# Patient Record
Sex: Male | Born: 1955 | Race: White | Hispanic: No | Marital: Married | Smoking: Never smoker
Health system: Southern US, Community
[De-identification: ages and names within clinical notes are randomized; demographics above are authoritative.]

## PROBLEM LIST (undated history)

## (undated) DIAGNOSIS — I10 Essential (primary) hypertension: Secondary | ICD-10-CM

## (undated) DIAGNOSIS — I2510 Atherosclerotic heart disease of native coronary artery without angina pectoris: Principal | ICD-10-CM

## (undated) DIAGNOSIS — E669 Obesity, unspecified: Secondary | ICD-10-CM

## (undated) DIAGNOSIS — Z955 Presence of coronary angioplasty implant and graft: Secondary | ICD-10-CM

## (undated) DIAGNOSIS — Z9289 Personal history of other medical treatment: Secondary | ICD-10-CM

## (undated) DIAGNOSIS — E119 Type 2 diabetes mellitus without complications: Secondary | ICD-10-CM

## (undated) DIAGNOSIS — I214 Non-ST elevation (NSTEMI) myocardial infarction: Secondary | ICD-10-CM

## (undated) DIAGNOSIS — E785 Hyperlipidemia, unspecified: Secondary | ICD-10-CM

## (undated) DIAGNOSIS — I82409 Acute embolism and thrombosis of unspecified deep veins of unspecified lower extremity: Secondary | ICD-10-CM

## (undated) DIAGNOSIS — E66811 Obesity, class 1: Secondary | ICD-10-CM

## (undated) HISTORY — DX: Obesity, unspecified: E66.9

## (undated) HISTORY — DX: Atherosclerotic heart disease of native coronary artery without angina pectoris: I25.10

## (undated) HISTORY — DX: Hyperlipidemia, unspecified: E78.5

## (undated) HISTORY — DX: Presence of coronary angioplasty implant and graft: Z95.5

## (undated) HISTORY — DX: Obesity, class 1: E66.811

## (undated) HISTORY — DX: Type 2 diabetes mellitus without complications: E11.9

## (undated) HISTORY — DX: Non-ST elevation (NSTEMI) myocardial infarction: I21.4

## (undated) HISTORY — DX: Acute embolism and thrombosis of unspecified deep veins of unspecified lower extremity: I82.409

## (undated) HISTORY — DX: Personal history of other medical treatment: Z92.89

---

## 2003-05-15 DIAGNOSIS — I82409 Acute embolism and thrombosis of unspecified deep veins of unspecified lower extremity: Secondary | ICD-10-CM

## 2003-05-15 HISTORY — PX: ORIF FEMUR FRACTURE: SHX2119

## 2003-05-15 HISTORY — DX: Acute embolism and thrombosis of unspecified deep veins of unspecified lower extremity: I82.409

## 2010-10-13 DIAGNOSIS — I2510 Atherosclerotic heart disease of native coronary artery without angina pectoris: Secondary | ICD-10-CM

## 2010-10-13 DIAGNOSIS — I214 Non-ST elevation (NSTEMI) myocardial infarction: Secondary | ICD-10-CM

## 2010-10-13 DIAGNOSIS — Z955 Presence of coronary angioplasty implant and graft: Secondary | ICD-10-CM

## 2010-10-13 HISTORY — DX: Atherosclerotic heart disease of native coronary artery without angina pectoris: I25.10

## 2010-10-13 HISTORY — DX: Presence of coronary angioplasty implant and graft: Z95.5

## 2010-10-13 HISTORY — DX: Non-ST elevation (NSTEMI) myocardial infarction: I21.4

## 2010-10-14 DIAGNOSIS — I2510 Atherosclerotic heart disease of native coronary artery without angina pectoris: Secondary | ICD-10-CM | POA: Insufficient documentation

## 2010-10-30 ENCOUNTER — Emergency Department (INDEPENDENT_AMBULATORY_CARE_PROVIDER_SITE_OTHER): Payer: BC Managed Care – PPO

## 2010-10-30 ENCOUNTER — Emergency Department (HOSPITAL_BASED_OUTPATIENT_CLINIC_OR_DEPARTMENT_OTHER)
Admission: EM | Admit: 2010-10-30 | Discharge: 2010-10-31 | Disposition: A | Discharge status: Other Acute Inpatient Hospital | Payer: BC Managed Care – PPO | Source: Home / Self Care | Attending: Emergency Medicine | Admitting: Emergency Medicine

## 2010-10-30 DIAGNOSIS — I214 Non-ST elevation (NSTEMI) myocardial infarction: Secondary | ICD-10-CM | POA: Insufficient documentation

## 2010-10-30 DIAGNOSIS — R079 Chest pain, unspecified: Secondary | ICD-10-CM

## 2010-10-30 LAB — COMPREHENSIVE METABOLIC PANEL
AST: 34 U/L (ref 0–37)
Albumin: 4.2 g/dL (ref 3.5–5.2)
Alkaline Phosphatase: 100 U/L (ref 39–117)
BUN: 17 mg/dL (ref 6–23)
Chloride: 100 mEq/L (ref 96–112)
Creatinine, Ser: 0.9 mg/dL (ref 0.50–1.35)
Potassium: 4.5 mEq/L (ref 3.5–5.1)
Total Bilirubin: 0.2 mg/dL — ABNORMAL LOW (ref 0.3–1.2)
Total Protein: 7.8 g/dL (ref 6.0–8.3)

## 2010-10-30 LAB — URINALYSIS, ROUTINE W REFLEX MICROSCOPIC
Glucose, UA: >1000 mg/dL — AB
Hgb urine dipstick: NEGATIVE
Leukocytes, UA: NEGATIVE
Protein, ur: NEGATIVE mg/dL
pH: 5.5 (ref 5.0–8.0)

## 2010-10-30 LAB — CK TOTAL AND CKMB (NOT AT ARMC)
CK, MB: 2.5 ng/mL (ref 0.3–4.0)
Relative Index: 2 (ref 0.0–2.5)

## 2010-10-30 LAB — URINE MICROSCOPIC-ADD ON
RBC / HPF: NONE SEEN RBC/hpf (ref <3)
WBC, UA: NONE SEEN WBC/hpf (ref <3)

## 2010-10-30 LAB — TROPONIN I: Troponin I: <0.3 ng/mL (ref <0.30)

## 2010-10-31 ENCOUNTER — Inpatient Hospital Stay (HOSPITAL_COMMUNITY)
Admission: AD | Admit: 2010-10-31 | Discharge: 2010-11-04 | DRG: 854 | Disposition: A | Discharge status: Home / Self Care | Payer: BC Managed Care – PPO | Source: Other Acute Inpatient Hospital | Attending: Cardiology | Admitting: Cardiology

## 2010-10-31 DIAGNOSIS — I2510 Atherosclerotic heart disease of native coronary artery without angina pectoris: Principal | ICD-10-CM | POA: Diagnosis present

## 2010-10-31 DIAGNOSIS — I10 Essential (primary) hypertension: Secondary | ICD-10-CM | POA: Diagnosis present

## 2010-10-31 DIAGNOSIS — E119 Type 2 diabetes mellitus without complications: Secondary | ICD-10-CM | POA: Diagnosis present

## 2010-10-31 DIAGNOSIS — E785 Hyperlipidemia, unspecified: Secondary | ICD-10-CM | POA: Diagnosis present

## 2010-10-31 DIAGNOSIS — I200 Unstable angina: Secondary | ICD-10-CM | POA: Diagnosis present

## 2010-10-31 DIAGNOSIS — Z86711 Personal history of pulmonary embolism: Secondary | ICD-10-CM

## 2010-10-31 DIAGNOSIS — Z87891 Personal history of nicotine dependence: Secondary | ICD-10-CM

## 2010-10-31 DIAGNOSIS — Z6839 Body mass index (BMI) 39.0-39.9, adult: Secondary | ICD-10-CM

## 2010-10-31 DIAGNOSIS — Z713 Dietary counseling and surveillance: Secondary | ICD-10-CM

## 2010-10-31 LAB — CK TOTAL AND CKMB (NOT AT ARMC)
CK, MB: 2.4 ng/mL (ref 0.3–4.0)
CK, MB: 1.8 ng/mL (ref 0.3–4.0)
Relative Index: 2.3 (ref 0.0–2.5)
Total CK: 106 U/L (ref 7–232)
Total CK: 85 U/L (ref 7–232)

## 2010-10-31 LAB — COMPREHENSIVE METABOLIC PANEL
AST: 33 U/L (ref 0–37)
Albumin: 3.6 g/dL (ref 3.5–5.2)
BUN: 18 mg/dL (ref 6–23)
Calcium: 9.1 mg/dL (ref 8.4–10.5)
Chloride: 100 mEq/L (ref 96–112)
Creatinine, Ser: 0.8 mg/dL (ref 0.50–1.35)
Total Bilirubin: 0.2 mg/dL — ABNORMAL LOW (ref 0.3–1.2)
Total Protein: 7.3 g/dL (ref 6.0–8.3)

## 2010-10-31 LAB — CBC
Platelets: 237 10*3/uL (ref 150–400)
RBC: 4.86 MIL/uL (ref 4.22–5.81)
RDW: 12.9 % (ref 11.5–15.5)
WBC: 6.9 10*3/uL (ref 4.0–10.5)

## 2010-10-31 LAB — TROPONIN I
Troponin I: <0.3 ng/mL (ref <0.30)
Troponin I: <0.3 ng/mL (ref <0.30)

## 2010-10-31 LAB — GLUCOSE, CAPILLARY: Glucose-Capillary: 216 mg/dL — ABNORMAL HIGH (ref 70–99)

## 2010-10-31 LAB — LIPID PANEL
Cholesterol: 263 mg/dL — ABNORMAL HIGH (ref 0–200)
HDL: 31 mg/dL — ABNORMAL LOW (ref >39)
Triglycerides: 570 mg/dL — ABNORMAL HIGH (ref <150)
VLDL: UNDETERMINED mg/dL (ref 0–40)

## 2010-11-01 LAB — HEPARIN LEVEL (UNFRACTIONATED)
Heparin Unfractionated: 0.15 IU/mL — ABNORMAL LOW (ref 0.30–0.70)
Heparin Unfractionated: 0.31 IU/mL (ref 0.30–0.70)

## 2010-11-01 LAB — GLUCOSE, CAPILLARY
Glucose-Capillary: 228 mg/dL — ABNORMAL HIGH (ref 70–99)
Glucose-Capillary: 138 mg/dL — ABNORMAL HIGH (ref 70–99)

## 2010-11-01 LAB — PROTIME-INR: INR: 1.01 (ref 0.00–1.49)

## 2010-11-01 LAB — CBC
HCT: 40.8 % (ref 39.0–52.0)
RDW: 13.2 % (ref 11.5–15.5)
WBC: 7.3 10*3/uL (ref 4.0–10.5)

## 2010-11-02 HISTORY — PX: CORONARY STENT PLACEMENT: SHX1402

## 2010-11-02 LAB — CBC
HCT: 37.3 % — ABNORMAL LOW (ref 39.0–52.0)
Hemoglobin: 13.1 g/dL (ref 13.0–17.0)
MCH: 29.2 pg (ref 26.0–34.0)
MCHC: 35.1 g/dL (ref 30.0–36.0)

## 2010-11-02 LAB — BASIC METABOLIC PANEL
BUN: 13 mg/dL (ref 6–23)
Calcium: 8.7 mg/dL (ref 8.4–10.5)
GFR calc non Af Amer: >60 mL/min (ref >60)
Glucose, Bld: 213 mg/dL — ABNORMAL HIGH (ref 70–99)

## 2010-11-02 LAB — GLUCOSE, CAPILLARY: Glucose-Capillary: 156 mg/dL — ABNORMAL HIGH (ref 70–99)

## 2010-11-03 HISTORY — PX: CORONARY ANGIOPLASTY WITH STENT PLACEMENT: SHX49

## 2010-11-03 LAB — BASIC METABOLIC PANEL
BUN: 11 mg/dL (ref 6–23)
CO2: 24 mEq/L (ref 19–32)
Chloride: 105 mEq/L (ref 96–112)
Creatinine, Ser: 0.76 mg/dL (ref 0.50–1.35)

## 2010-11-03 LAB — GLUCOSE, CAPILLARY
Glucose-Capillary: 170 mg/dL — ABNORMAL HIGH (ref 70–99)
Glucose-Capillary: 152 mg/dL — ABNORMAL HIGH (ref 70–99)
Glucose-Capillary: 207 mg/dL — ABNORMAL HIGH (ref 70–99)

## 2010-11-03 LAB — CBC
Hemoglobin: 12.7 g/dL — ABNORMAL LOW (ref 13.0–17.0)
MCH: 28.6 pg (ref 26.0–34.0)
MCV: 83.8 fL (ref 78.0–100.0)
RBC: 4.44 MIL/uL (ref 4.22–5.81)

## 2010-11-03 LAB — POCT ACTIVATED CLOTTING TIME: Activated Clotting Time: 413 seconds

## 2010-11-03 LAB — HEPARIN LEVEL (UNFRACTIONATED): Heparin Unfractionated: 0.42 IU/mL (ref 0.30–0.70)

## 2010-11-04 LAB — CBC
HCT: 38.6 % — ABNORMAL LOW (ref 39.0–52.0)
MCV: 83.4 fL (ref 78.0–100.0)
RDW: 13.3 % (ref 11.5–15.5)
WBC: 7.7 10*3/uL (ref 4.0–10.5)

## 2010-11-04 LAB — BASIC METABOLIC PANEL
CO2: 22 mEq/L (ref 19–32)
Calcium: 8.6 mg/dL (ref 8.4–10.5)
Glucose, Bld: 138 mg/dL — ABNORMAL HIGH (ref 70–99)
Sodium: 138 mEq/L (ref 135–145)

## 2010-11-04 NOTE — Cardiovascular Report (Signed)
NAME:  Gregory, Gregory NO.:  [id number]  MEDICAL RECORD NO.:  [id number]  LOCATION:  6529                         FACILITY:  [facility]  PHYSICIAN:  Landry Corporal, MD DATE OF BIRTH:  06-Jan-1956  DATE OF PROCEDURE:  11/02/2010 DATE OF DISCHARGE:                           CARDIAC CATHETERIZATION   PRIMARY CARDIOLOGIST:  Thurmon Fair, MD, [facility].  PERFORMING PHYSICIAN:  Landry Corporal, MD.  PROCEDURE PERFORMED: 1. Successful percutaneous coronary intervention to the mid LAD, 90-     95% lesion decreased to 0% with an Integrity Resolute Drug-Eluting     Stent 2.75-mm x 14-mm, post-dilated proximally to 3.3-mm, and distally     to 3.1-mm. 2. Successful percutaneous coronary intervention to the proximal     second diagonal branch with an Integrity Resolute Drug-Eluting Stent,     2.25-mm x 12-mm stent, post-dilated to 2.3 mm. 3. Intracoronary nitroglycerin.  INDICATIONS:  Unstable angina with multivessel disease.  BRIEF HISTORY:  Gregory Gregory is a 55 year old gentleman who was admitted initially to the Hospitalist Service on June 19 with significant chest pain that was exertional associated with shortness of breath and diaphoresis.  This has been going on for the last several weeks, has been progressively worsened and concerning for crescendo angina.  He was seen by Dr. Royann Shivers in consultation and decision was made to proceed with diagnostic cardiac catheterization which was performed on the 20th and this showed essentially 4 vessel disease with the most significant being across the mid proximal LAD lesion of 95% as well as second diagonal branch lesion that was at least 70%.  This is in addition to a smaller obtuse marginal branch lesion of 90% in the RCA lesion.  There is a small punctate ulcerated lesion that is 85-90%.  Decision was made after a long discussion with the patient based on the focal nature of the  disease that he would likely benefit more from multivessel percutaneous intervention of drug-eluting stents, then going for coronary bypass grafting for the existing lesions.  The patient was in favor of this plan.  I also discussed with several of my colleagues. All of them agreed that this was a good option for the patient even though he has diagnosis of diabetes.  Informed consent was obtained and signed form placed on the chart after the risks, benefits, alternatives, and indications of procedure were explained.  PROCEDURE:  The patient was brought to the second floor of [facility] in a fasting state.  He was prepped and draped in a usual sterile fashion for femoral artery access.  After time- out period was performed, the patient was sedated with intravenous Versed and fentanyl.  The right femoral head was localized using tactile and fluoroscopic guidance.  The right groin was anesthetized using 1% subcutaneous lidocaine.  Right femoral artery was then accessed using the modified Seldinger technique with placement of 6-French sheath. Once the sheath was aspirated and flushed, a 6-French XB 3.5 guide catheter was advanced over wire and used to engage left main coronary artery.  At that time of sheath placement, Angiomax bolus was administered and drip was initiated.  After 2 minutes, ACT was  checked and confirmed greater than 200 seconds.  At this time, diagnostic imaging was performed with intracoronary nitroglycerin.  INTERVENTIONAL PROCEDURE: Lesion #1:  Mid LAD, 90% napkin ring lesion just after the D2 with TIMI 3 flow      Post Intervention: 0% with TIMI 3 flow.   Lesion #2:  Proximal D2: Tandem 70% and 60%; TIMI 3 flow      Post Intervention: 0%, TIMI 3 flow Guide:  Cordis XB 3.5; Guidewire:  BMW - LAD, Prowater - D2  At this time both the LAD and the D2 branch were wired.  Initially the LAD lesion was pre-dilated:    * Predilatation balloon  Emerge 2.5-mm x 8-mm:    8 atmospheres for 28 seconds x2.    After predilatation of the LAD, attention was turned to Lesion #2 in D2 which was  treated with direct stenting technique.     *  Lesion #2, D2 -- Stent:  Integrity Resolute DES 2.25 mm x 12 mm:  10 atmospheres 45 seconds.  12 atmospheres 20 seconds -- Final Diameter = 2.3 mm.    *  Lesion #1, LAD -- Stent:  Integrity Resolute DES 2.75-mm x 14-mm.  14 atmospheres 30 seconds.  16 atmospheres 45 seconds, Final distal diameter = 3.1 mm.       Postdilatation balloon:  Crowder Trek 3.28 x 8 mm.   14 atmospheres 45 seconds - final promimal diameter = 3.3 mm.  Scout angiography after each inflation demonstrated no evidence of perforation or dissection.  The diagonal lesion was not all way to the ostium of the LAD and therefore kissing balloon was not performed. There was slight jailing of the diagonal branch but brisk TIMI 3 flow distally.  At this time, based on the amount of contrast used, decision was made to discontinue the procedure at this point and to perform intervention on the additional 2 lesions another day.  IMPRESSION:  Successful 2-vessel PCI of the mid left anterior descending and proximal second diagonal branch lesions with drug-eluting stents.  PLAN: 1. Continue aspirin and prasugrel and titrate up cardiac medications. 2. Restart heparin 4 hours after sheath pull. 3. Proceed with staged PCI of the RCA in OM1 tomorrow if the creatinine is okay.          ______________________________ Landry Corporal, MD    DWH/MEDQ  D:  11/03/2010  T:  11/03/2010  Job:  [id number]  cc:   Thurmon Fair, MD  Electronically Signed by Bryan Lemma MD on 11/04/2010 11:53:30 AM

## 2010-11-04 NOTE — Cardiovascular Report (Signed)
NAME:  Gregory Gregory, BORG NO.:  [id number]  MEDICAL RECORD NO.:  [id number]  LOCATION:  6529                         FACILITY:  [facility]  PHYSICIAN:  Landry Corporal, MD DATE OF BIRTH:  12/02/1955  DATE OF PROCEDURE:  11/03/2010 DATE OF DISCHARGE:                           CARDIAC CATHETERIZATION   PRIMARY CARDIOLOGIST:  Thurmon Fair, MD  PERFORMING PHYSICIAN:  Landry Corporal, MD.  PROCEDURE PERFORMED: 1. Left heart catheterization via 5-French right radial artery access     for measuring left ventricular hemodynamics. 2. Native coronary angiography to evaluate the LAD and diagonal stents     placed on the day preceding. 3. Intracoronary nitroglycerin x2. 4. Percutaneous coronary intervention of the mid RCA with resolute DES     2.7 x 32 mm stent increased to 3.0 mm. 5. Successful PCI to the proximal first obtuse marginal branch (OM1) with 2     overlapping resolute DES stents 2.25 x 12 and 2.25 x 8 mm increased     to 2.3 mm.  INDICATIONS:  Multivessel coronary artery disease with newly diagnosed diabetes and unstable angina.  BRIEF HISTORY:  Gregory Gregory is a patient well-known to me.  Please see the last preceding 2 catheterizations describes as history.  He is now referred for the second part portion of a stage PCI to the RCA and OM1. Again, the risks, benefits, alternatives, and indications were explained to the patient in detail and informed consent was obtained with a signed form placed on chart.  The patient was brought to the Second Floor of Cardiac Catheterization Lab and prepped and draped in usual sterile fashion for radial access as a Barbeau test was performed and found to be normal.  After time-out period was performed.  The patient was sedated with intravenous Versed and fentanyl.  The right wrist was anesthetized using 1%  subcutaneous lidocaine and the right radial artery was accessed using the Seldinger technique with placement  of 6-French sheath.  A total of 10 mL dilated radial cocktail was infiltrated into the sheath.  The patient was then had Angiomax bolus was administered with a drip initiated.  The safety J- wire was advanced using a 5-French JR-4 catheter into the recent ascending aortic cusp.  The 5-French catheter was then exchanged for a 6- [place] JR-4 guide catheter which was then used to advance into the aortic valve into the aortic cusps.  The catheter first entered into the left ventricle for measuring left ventricular hemodynamics and then pullback gradient to see how he is doing as far as filling pressures. The catheter was then at that time directed into the right coronary artery.  INTERVENTION PROCEDURE: Lesion #1: mid RCA 80% to 85% with diffuse 50-60% lesions just after         Post PCI: reduced to 0%          TIMI 3 flow pre and post.  Guide Catheter: 6-French JR-4; Guidewire Prowater.  Predilatation balloon Trek 2.5 mm x 50 mm balloon:          8 atmospheres for 35 seconds.  Stent: Integrity Resolute DES 2.75 mm x 20 mm    12 atmospheres for 35 seconds  14 atmospheres at 35 seconds  Postdilatation balloon is a 3.0 x 15 mm Trek balloon:   Distal: 14 atmospheres for 45 seconds    Proximal:  16 atmospheres for 45 seconds.  Angiographic evaluation, post PCI demonstrated excellent stent positioning with no dissection perforation.  The attention was then turned to the left coronary system.  The R4 catheter was exchanged over a wire.  Initially, a 6-French XB 3.5 guide catheter was unable to engage the left coronary artery and therefore, it was exchanged for a 16-French JL 35 guide catheter, which was then used to successfully engage the left coronary artery.  Lesion #2: Proximal OM-1 95% lesion, TIMI 3 flow     Post PCI: 0% with the TIMI 3 flow   Guide Catheter:  JL-3.5; Guidewire:  Intubation.  Stent #1 Resolute DES 2.25 mm x 12 mm.     9 atmospheres for 30 seconds. With placement  of the stent, the patient did have significant anginal chest pain. The stent was deployed at nominal atmospheres.  After initial deployment, there was a focal lesion just proximal to the proximal leading edge of the stent.  Therefore,  I decided to proceed with deploying an overlapping stent to cover this lesion in the proximal segment just beyond the ostium of the OM1.   Stent #2:  Integrity Resolute DES 2.25 x 8 mm    14 atmospheres at 50 seconds.   18 atmospheres at 25 seconds; at the overlap section.  After the second stent, there was brisk TIMI 3 flow distally and no evidence of proximal  or distal dissection.  The proximal lesion was well covered.  The wire was removed  completely out of the body and the catheter was then exchange, it was removed completely  out of the body over a wire without any complications.  The sheath was removed in the cath lab, with placement of a TR Band at 16 cm of air.  The patient was stable before and after the procedure.  The chest pain was relieved at the end of BiV second balloon stent inflation in the circumflex.  He was  hemodynamically stable before, during, and after the procedure with no complications.  Estimated blood loss was less than 20 mL.  The Angiomax was discontinued at the time of TR band placed.  The patient was returned to the holding area for monitoring and then will go to 6500 for post PCI care.  IMPRESSION: 1. Successful multivessel percutaneous coronary intervention of the     mid right coronary artery and proximal obtuse marginal 1 as part of     a 4-vessel percutaneous coronary intervention with 2-vessel done     yesterday. 2. Widely patent left anterior descending and second diagonal stents. 3. Mild increase IVP after hydration with no evidence of congestive     heart failure.  PLAN: 1. The patient will return to 6500 for overnight monitoring and post     radial care. 2. Continued to do dual antiplatelet therapy for  at least a year. 3. He continues diabetic care with [facility] assisting use     with his dosing regimen. 4. We will need to be established for post discharge  primary care     followup. 5. Continue his statin, ACE inhibitor and beta-blocker.  We would like     to be increase the ACE inhibitor upon initial follow-up. 6. Cardiac rehab as scheduled.  We will anticipate discharge in the morning.  The patient is  stable.  He is either followup with myself or Dr. Royann Shivers.          ______________________________ Landry Corporal, MD     DWH/MEDQ  D:  11/03/2010  T:  11/04/2010  Job:  [id number]  cc:   Thurmon Fair, MD  Electronically Signed by Bryan Lemma MD on 11/04/2010 12:04:29 PM
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
## 2012-02-04 ENCOUNTER — Emergency Department (HOSPITAL_BASED_OUTPATIENT_CLINIC_OR_DEPARTMENT_OTHER): Payer: BC Managed Care – PPO

## 2012-02-04 ENCOUNTER — Encounter (HOSPITAL_BASED_OUTPATIENT_CLINIC_OR_DEPARTMENT_OTHER): Payer: Self-pay | Admitting: Emergency Medicine

## 2012-02-04 ENCOUNTER — Emergency Department (HOSPITAL_BASED_OUTPATIENT_CLINIC_OR_DEPARTMENT_OTHER)
Admission: EM | Admit: 2012-02-04 | Discharge: 2012-02-04 | Disposition: A | Discharge status: Home / Self Care | Payer: BC Managed Care – PPO | Attending: Emergency Medicine | Admitting: Emergency Medicine

## 2012-02-04 DIAGNOSIS — N390 Urinary tract infection, site not specified: Secondary | ICD-10-CM | POA: Insufficient documentation

## 2012-02-04 DIAGNOSIS — Z79899 Other long term (current) drug therapy: Secondary | ICD-10-CM | POA: Insufficient documentation

## 2012-02-04 DIAGNOSIS — Z7982 Long term (current) use of aspirin: Secondary | ICD-10-CM | POA: Insufficient documentation

## 2012-02-04 DIAGNOSIS — N200 Calculus of kidney: Secondary | ICD-10-CM | POA: Insufficient documentation

## 2012-02-04 DIAGNOSIS — E119 Type 2 diabetes mellitus without complications: Secondary | ICD-10-CM | POA: Insufficient documentation

## 2012-02-04 DIAGNOSIS — I10 Essential (primary) hypertension: Secondary | ICD-10-CM | POA: Insufficient documentation

## 2012-02-04 HISTORY — DX: Essential (primary) hypertension: I10

## 2012-02-04 LAB — URINALYSIS, ROUTINE W REFLEX MICROSCOPIC
Bilirubin Urine: NEGATIVE
Glucose, UA: NEGATIVE mg/dL
Specific Gravity, Urine: 1.017 (ref 1.005–1.030)
pH: 5 (ref 5.0–8.0)

## 2012-02-04 LAB — URINE MICROSCOPIC-ADD ON

## 2012-02-04 MED ORDER — KETOROLAC TROMETHAMINE 60 MG/2ML IM SOLN
60.0000 mg | Freq: Once | INTRAMUSCULAR | Status: AC
  Administered 2012-02-04: 60 mg via INTRAMUSCULAR
  Filled 2012-02-04: qty 2

## 2012-02-04 MED ORDER — CIPROFLOXACIN HCL 500 MG PO TABS: 500.0000 mg | ORAL_TABLET | Freq: Two times a day (BID) | ORAL | Status: DC

## 2012-02-04 MED ORDER — HYDROCODONE-ACETAMINOPHEN 5-325 MG PO TABS: 2.0000 | ORAL_TABLET | ORAL | Status: DC | PRN

## 2012-02-04 MED ORDER — IBUPROFEN 600 MG PO TABS: 600.0000 mg | ORAL_TABLET | Freq: Four times a day (QID) | ORAL | Status: DC | PRN

## 2012-02-04 NOTE — ED Notes (Signed)
C/o of left sided back pain that began this Friday. C/o of urinary frequency

## 2012-02-04 NOTE — ED Provider Notes (Signed)
History     CSN: [id number]  Arrival date & time 02/04/12  0709   First MD Initiated Contact with Patient 02/04/12 [phone]      Chief Complaint  Patient presents with  . Back Pain     HPI C/o of left sided back pain that began this Friday. C/o of urinary frequency.  Some dysuria.  No documented fever.  No nausea vomiting.  Past Medical History  Diagnosis Date  . Hypertension   . Diabetes mellitus     Past Surgical History  Procedure Date  . Coronary stent placement     No family history on file.  History  Substance Use Topics  . Smoking status: Never Smoker   . Smokeless tobacco: Not on file  . Alcohol Use: No      Review of Systems  All other systems reviewed and are negative.    Allergies  Review of patient's allergies indicates no known allergies.  Home Medications   Current Outpatient Rx  Name Route Sig Dispense Refill  . ASPIRIN 325 MG PO TBEC Oral Take 325 mg by mouth daily.    Marland Kitchen LISINOPRIL 5 MG PO TABS Oral Take 5 mg by mouth daily.    Marland Kitchen METOPROLOL TARTRATE 50 MG PO TABS Oral Take 50 mg by mouth 2 (two) times daily.    . OMEGA-3-ACID ETHYL ESTERS 1 G PO CAPS Oral Take 2 g by mouth 2 (two) times daily.    Marland Kitchen PRASUGREL HCL 10 MG PO TABS Oral Take by mouth.    . ROSUVASTATIN CALCIUM 10 MG PO TABS Oral Take 10 mg by mouth daily.    Marland Kitchen CIPROFLOXACIN HCL 500 MG PO TABS Oral Take 1 tablet (500 mg total) by mouth every 12 (twelve) hours. 10 tablet 0  . HYDROCODONE-ACETAMINOPHEN 5-325 MG PO TABS Oral Take 2 tablets by mouth every 4 (four) hours as needed for pain. 15 tablet 0  . IBUPROFEN 600 MG PO TABS Oral Take 1 tablet (600 mg total) by mouth every 6 (six) hours as needed for pain. 30 tablet 0    BP 150/63  Pulse 54  Temp 97.6 F (36.4 C) (Oral)  Resp 18  SpO2 100%  Physical Exam  Nursing note and vitals reviewed. Constitutional: He is oriented to person, place, and time. He appears well-developed. No distress.  HENT:  Head: Normocephalic and  atraumatic.  Eyes: Pupils are equal, round, and reactive to light.  Neck: Normal range of motion.  Cardiovascular: Normal rate and intact distal pulses.   Pulmonary/Chest: No respiratory distress.  Abdominal: Normal appearance. He exhibits no distension. There is no tenderness. There is no rebound and no guarding.  Genitourinary:       No CVA tenderness.  Musculoskeletal: Normal range of motion.  Neurological: He is alert and oriented to person, place, and time. No cranial nerve deficit.  Skin: Skin is warm and dry. No rash noted.  Psychiatric: He has a normal mood and affect. His behavior is normal.    ED Course  Procedures (including critical care time) Scheduled Meds:    . ketorolac  60 mg Intramuscular Once   Continuous Infusions:  PRN Meds:.  Labs Reviewed  URINALYSIS, ROUTINE W REFLEX MICROSCOPIC - Abnormal; Notable for the following:    APPearance CLOUDY (*)     Hgb urine dipstick LARGE (*)     Leukocytes, UA LARGE (*)     All other components within normal limits  URINE MICROSCOPIC-ADD ON - Abnormal; Notable for the  following:    Squamous Epithelial / LPF FEW (*)     Bacteria, UA MANY (*)     Casts GRANULAR CAST (*)     All other components within normal limits  URINE CULTURE   Ct Abdomen Pelvis Wo Contrast  02/04/2012  *RADIOLOGY REPORT*  Clinical Data:  Left side abdominal pain  CT ABDOMEN AND PELVIS WITHOUT CONTRAST  Technique:  Multidetector CT imaging of the abdomen and pelvis was performed following the standard protocol without intravenous contrast. Sagittal and coronal MPR images reconstructed from axial data set.  Comparison: None  Findings: Lung bases clear. IVC filter noted. Tiny nonobstructing calculus left kidney image 32. Probable small cysts left kidney images 37 and 39. No additional urinary tract calcification, hydronephrosis, or ureteral dilatation. Bladder decompressed. Minimal prostatic enlargement. Tiny umbilical hernia containing fat.  Within limits  of a nonenhanced exam no focal abnormalities of the liver, spleen, pancreas, adrenal glands, or right kidney. Small splenule at splenic hilum. Minimal cysts descending colonic diverticulosis without evidence of diverticulitis. Medication tablets within bowel. Normal appendix coiled adjacent to cecal tip. Stomach and bowel loops otherwise normal appearance. No mass, adenopathy, free fluid, or inflammatory process. No acute osseous findings. Bulging discs at L3-L4 L4-L5  IMPRESSION: Tiny nonobstructing calculus and probable tiny cyst left kidney. No definite hydronephrosis or ureteral dilatation. Minimal colonic diverticulosis. Tiny umbilical hernia containing fat.   Original Report Authenticated By: Lollie Marrow, M.D.      1. UTI (lower urinary tract infection)       MDM          Nelia Shi, MD 02/05/12 [phone]

## 2012-02-06 LAB — URINE CULTURE

## 2012-09-13 IMAGING — CR DG CHEST 2V
2 series · 2 of 2 positions shown · non-contrast
Comparison: None

CLINICAL DATA: Chest pain

CHEST - 2 VIEW

[w chest pa]
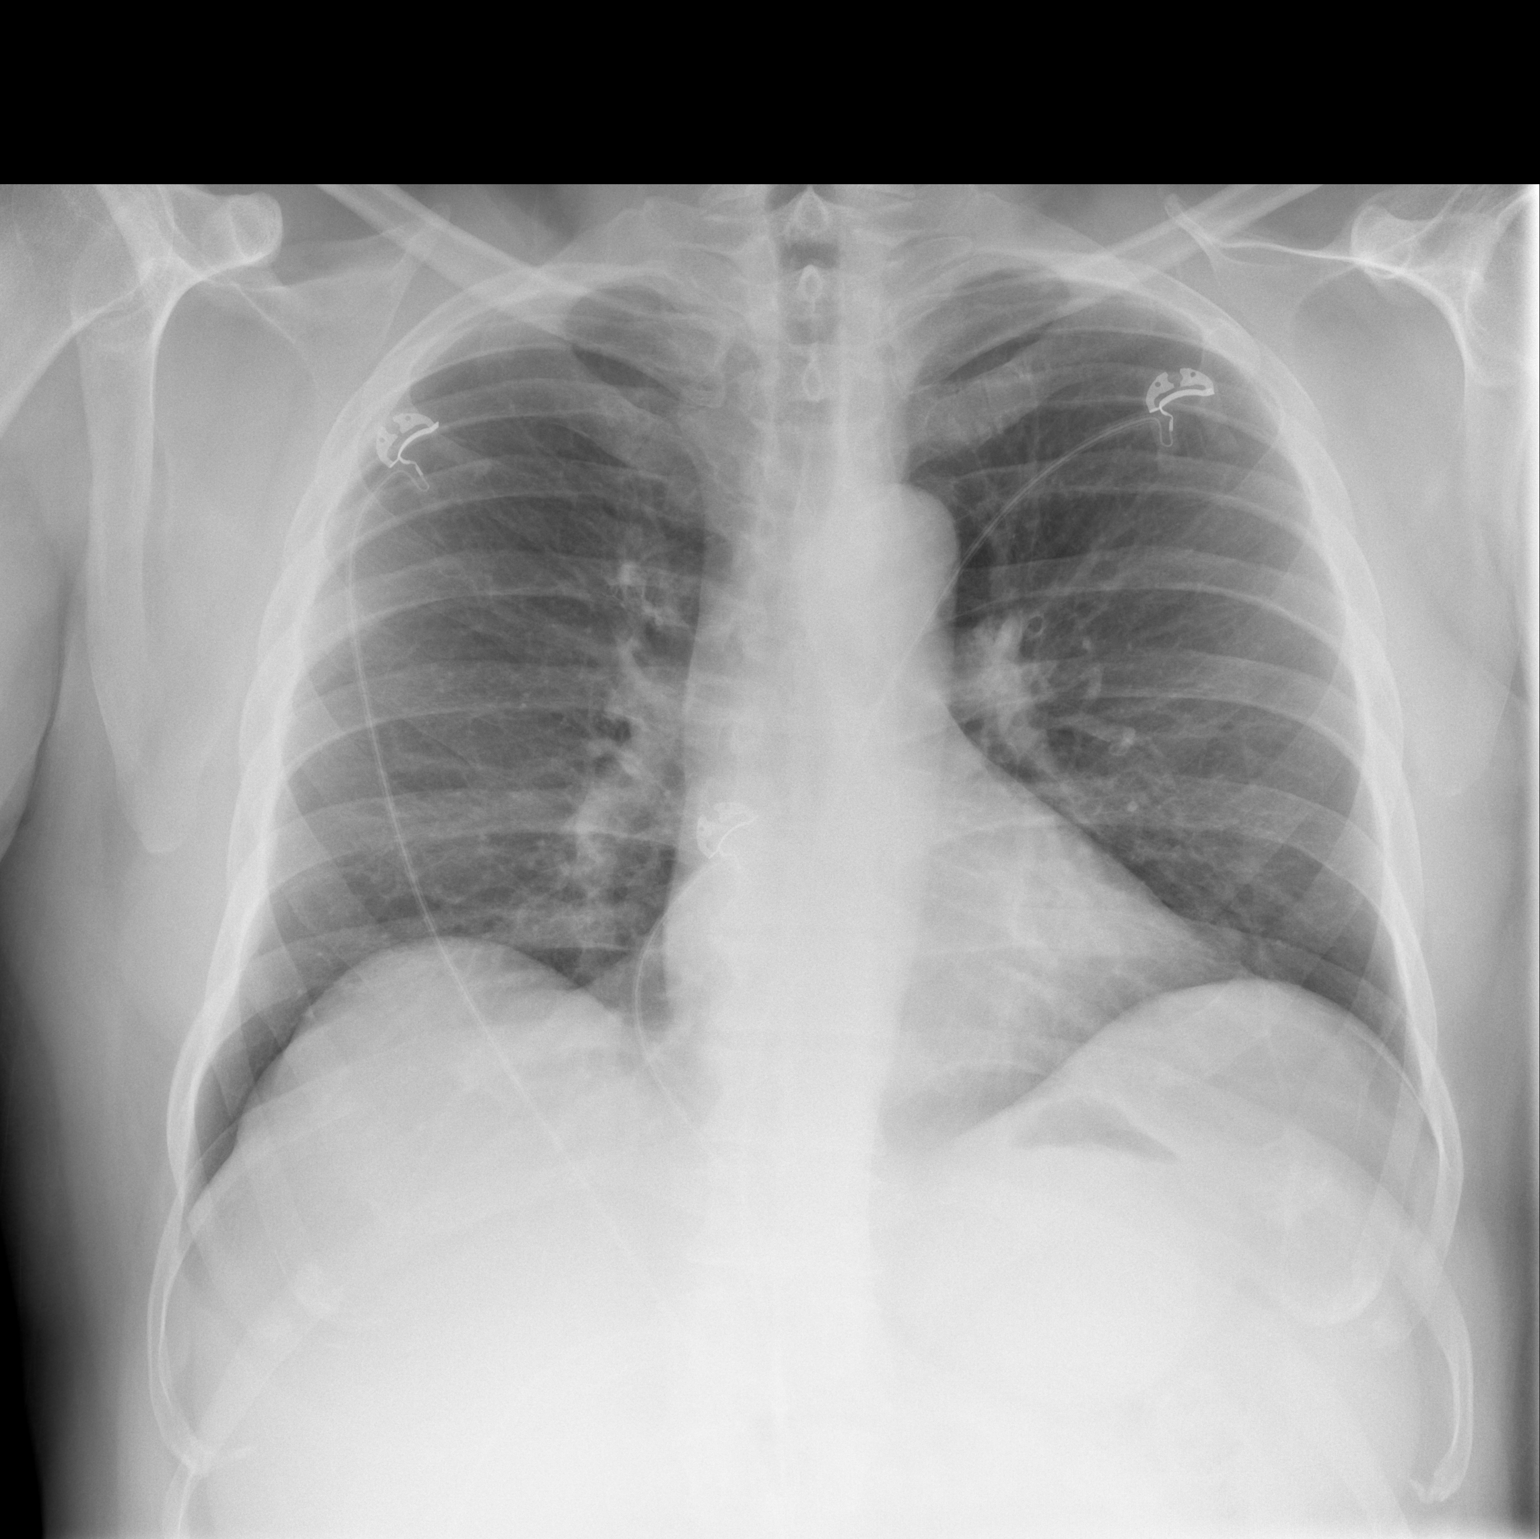

[w chest lat]
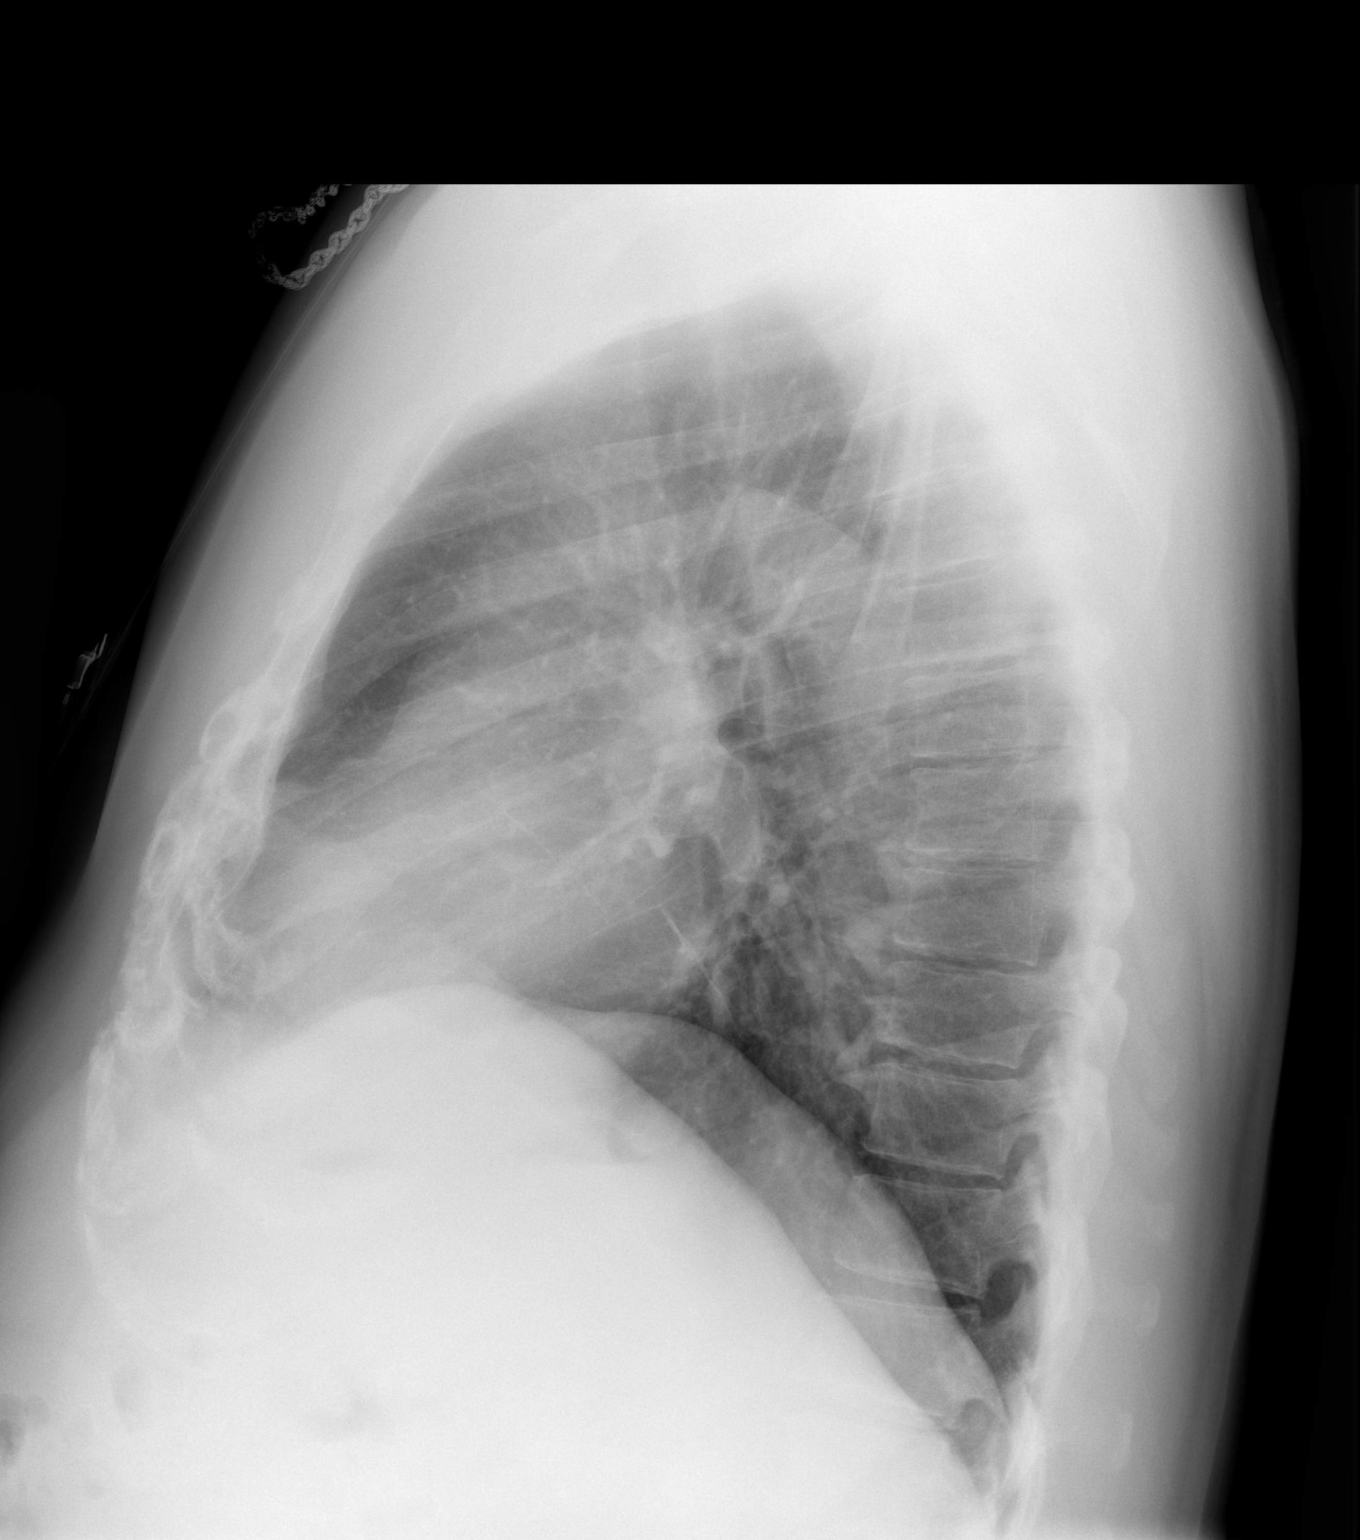

[2 of 2 positions shown; findings below may reference images not displayed]

FINDINGS: The heart size and mediastinal contours are within normal
limits.  Both lungs are clear.  The visualized skeletal structures
are unremarkable.
IMPRESSION: No active cardiopulmonary abnormalities

## 2012-12-04 ENCOUNTER — Other Ambulatory Visit: Payer: Self-pay | Admitting: Physician Assistant

## 2012-12-31 ENCOUNTER — Encounter: Payer: Self-pay | Admitting: Cardiology

## 2012-12-31 DIAGNOSIS — I2510 Atherosclerotic heart disease of native coronary artery without angina pectoris: Secondary | ICD-10-CM

## 2012-12-31 DIAGNOSIS — E1169 Type 2 diabetes mellitus with other specified complication: Secondary | ICD-10-CM | POA: Insufficient documentation

## 2012-12-31 DIAGNOSIS — E785 Hyperlipidemia, unspecified: Secondary | ICD-10-CM

## 2012-12-31 DIAGNOSIS — I10 Essential (primary) hypertension: Secondary | ICD-10-CM | POA: Insufficient documentation

## 2012-12-31 DIAGNOSIS — E669 Obesity, unspecified: Secondary | ICD-10-CM

## 2013-01-02 ENCOUNTER — Encounter: Payer: Self-pay | Admitting: Cardiology

## 2013-01-05 ENCOUNTER — Ambulatory Visit (INDEPENDENT_AMBULATORY_CARE_PROVIDER_SITE_OTHER): Payer: BC Managed Care – PPO | Admitting: Cardiology

## 2013-01-05 ENCOUNTER — Encounter: Payer: Self-pay | Admitting: Cardiology

## 2013-01-05 VITALS — BP 122/68 | HR 44 | Ht 70.0 in | Wt 246.1 lb

## 2013-01-05 DIAGNOSIS — Z79899 Other long term (current) drug therapy: Secondary | ICD-10-CM

## 2013-01-05 DIAGNOSIS — Z9861 Coronary angioplasty status: Secondary | ICD-10-CM

## 2013-01-05 DIAGNOSIS — E782 Mixed hyperlipidemia: Secondary | ICD-10-CM

## 2013-01-05 DIAGNOSIS — E785 Hyperlipidemia, unspecified: Secondary | ICD-10-CM

## 2013-01-05 DIAGNOSIS — I10 Essential (primary) hypertension: Secondary | ICD-10-CM

## 2013-01-05 DIAGNOSIS — E669 Obesity, unspecified: Secondary | ICD-10-CM

## 2013-01-05 DIAGNOSIS — E119 Type 2 diabetes mellitus without complications: Secondary | ICD-10-CM

## 2013-01-05 DIAGNOSIS — I2510 Atherosclerotic heart disease of native coronary artery without angina pectoris: Secondary | ICD-10-CM

## 2013-01-05 DIAGNOSIS — E1169 Type 2 diabetes mellitus with other specified complication: Secondary | ICD-10-CM

## 2013-01-05 LAB — LIPID PANEL
HDL: 35 mg/dL — ABNORMAL LOW (ref >39)
Triglycerides: 165 mg/dL — ABNORMAL HIGH (ref <150)

## 2013-01-05 LAB — COMPREHENSIVE METABOLIC PANEL
Albumin: 4.7 g/dL (ref 3.5–5.2)
BUN: 21 mg/dL (ref 6–23)
Calcium: 9.5 mg/dL (ref 8.4–10.5)
Chloride: 104 mEq/L (ref 96–112)
Glucose, Bld: 112 mg/dL — ABNORMAL HIGH (ref 70–99)
Potassium: 4.7 mEq/L (ref 3.5–5.3)
Total Protein: 7.7 g/dL (ref 6.0–8.3)

## 2013-01-05 MED ORDER — ROSUVASTATIN CALCIUM 10 MG PO TABS: 10.0000 mg | ORAL_TABLET | Freq: Every day | ORAL | Status: DC

## 2013-01-05 MED ORDER — LISINOPRIL 10 MG PO TABS: 10.0000 mg | ORAL_TABLET | Freq: Every day | ORAL | Status: DC

## 2013-01-05 MED ORDER — OMEGA-3-ACID ETHYL ESTERS 1 G PO CAPS: 2.0000 g | ORAL_CAPSULE | Freq: Two times a day (BID) | ORAL | Status: DC

## 2013-01-05 MED ORDER — METOPROLOL TARTRATE 25 MG PO TABS: ORAL_TABLET | ORAL | Status: DC

## 2013-01-05 NOTE — Assessment & Plan Note (Signed)
Stable. No angina or CHF symptoms. We had switched from Effient to Plavix & subsequently full dose aspirin for prophylaxis. He is currently on beta blocker ACE inhibitor and statin.  Plan: Continue current medication regimen.

## 2013-01-05 NOTE — Assessment & Plan Note (Signed)
Not unexpectedly, he sort of a plateau of weight loss. He was able to be down about 10 pounds lower years now. He just is down a little bit "lax on his die"t. Is not quite the same intensity of exercises that he used to do.  Plan: I encouraged him to continue to go forward. He was doing great well I do want him to get discouraged. His dramatically improves himself. His target weight loss goal now is only 24 pounds this year.

## 2013-01-05 NOTE — Progress Notes (Signed)
Patient ID: Gregory Gregory, male   DOB: 1956/04/08, 57 y.o.   MRN: [id number] PCP: Thayer Headings, MD  Clinic Note: Chief Complaint  Patient presents with  . Annual Exam    no chest pain, edema some work 12 hour days,some sob if exercising,     HPI: Gregory Gregory is a 57 y.o. male who I first met back in June of 2000 1220 presented with a non-ST elevation MI. At that time he was obese 80 pounds heavier than he is now. He newly diagnosed diabetes and hypertension as well as dyslipidemia. He then underwent cardiac catheterization which revealed significant LAD diagonal bifurcation disease, OM1 disease and RCA disease. After long discussion, given his young age, we agreed that the best plan treatment option was to proceed with multivessel PCI as opposed to CABG. He then underwent staged PCI of first the RCA and OM and an in the LAD- diagonal bifurcation as described below. Within the first year of his intervention, he lost 80 pounds. He is now off of diabetes medications, and is pressures are extremely well controlled. I last saw him a year ago, I backed off on his beta blocker to 25 mg twice a day due to the bradycardia. His lipids are relatively well controlled as well.  Interval History: Today comes in again in good spirits. Feeling well. His weight has been stabilized and plateaued now it. He said he went down to about 235, but then injured himself in and and was able to exercise much as he had been doing. His is also minimally "lax with his eating habits. He still is translucent weight though. He does need to get back into his regular set of routine eating. He walks now 3 days a week and Mr. the amount of time in this case. Says because of wrong or do strenuous activity he gets a little short of breath. Never has any chest tightness or chest pressure associated with it. No PND, orthopnea with only mild intermittent edema. No melena, hematochezia or hematuria. We stopped his Plavix a while ago it  increased him to 325 mg of aspirin. He denies any lightheadedness, dizziness no syncope or near syncope. No TIA or amaurosis fugax symptoms.  No fatigue or exercise intolerance.  Past Medical History  Diagnosis Date  . NSTEMI (non-ST elevated myocardial infarction) 10/2010    multivessel PCI  . CAD (coronary artery disease) 10/2010    multivessel PCI RCA; OM1; LAD;diag  . Presence of drug coated stent in anterior descending branch of left coronary artery June 2012    Resolute DES stents: LAD- 2.75 mm x 14 mm -- 3.3 mm; Diag - 2.25 mm x 12 mm -- 2.3 mm  . Presence of drug coated stent in left circumflex coronary artery June 2012    OM1 -- 2 overlapping Resolute DES Stents: 2.25 mm 8mm, x 71m  . Presence of drug coated stent in right coronary artery June 2012    Mid RCA 2.75 mm x 22 mm  . H/O echocardiogram     EF 50-60%; grade 1 diastolic dysfunction; trivial TR  . Hypertension   . Diabetes mellitus type 2, controlled     No longer on Medcations after weight loss.  . Obesity (BMI 30.0-34.9)     Lost  > 80 lb since NSTEMI  . Hyperlipidemia LDL goal < 70     Prior Cardiac Evaluation and Past Surgical History: Past Surgical History  Procedure Laterality Date  . Coronary stent placement  11/01/12  PCI to mid LAD with integrity resolute DES; PCI to proximal second diagonal branch with resolute integrity  . Coronary angioplasty with stent placement  11/02/2012    PCI of mid RCA and proximal OM 1 with Resolute DESs  . Orif femur fracture Right     Allergies  Allergen Reactions  . Bee Venom     Current Outpatient Prescriptions  Medication Sig Dispense Refill  . aspirin 325 MG EC tablet Take 325 mg by mouth daily.      Gregory Kitchen omega-3 acid ethyl esters (LOVAZA) 1 G capsule Take 2 capsules (2 g total) by mouth 2 (two) times daily.  30 capsule  11  . rosuvastatin (CRESTOR) 10 MG tablet Take 1 tablet (10 mg total) by mouth daily.  30 tablet  11  . lisinopril (PRINIVIL,ZESTRIL) 10 MG tablet  Take 1 tablet (10 mg total) by mouth daily.  30 tablet  11  . metoprolol tartrate (LOPRESSOR) 25 MG tablet Take 1/2 tablet twice a day  30 tablet  11   No current facility-administered medications for this visit.   At the time of this visit -- was taking Lisinopril 5 mg & Metoprolol 1/2 of 50 mg tab bid -- listed medications reflect changes made.  History   Social History Narrative   He is a divorced father of 5, grandfather 1. He now has a new long-term life partner, usually comes with him to appointments the ED his exercise routinely walking at least 2-3 miles a day and this case to 4 days a week. He also does him some strength and conditioning exercises as well.   He never was a smoker. He does not drink alcohol.   ROS: A comprehensive Review of Systems - Negative except Pertinent Symptoms noted above. Otherwise only mild joint aches & pains.  A bit frustrated at "plateau in wgt loss" Remainder is negative.  PHYSICAL EXAM BP 122/68  Pulse 44  Ht 5\' 10"  (1.778 m)  Wt 246 lb 1.6 oz (111.63 kg)  BMI 35.31 kg/m2 General: he is a very pleasant, healthy-appearing gentleman. He continues to be in a very good mood, very pleasant mood and affect. HEENT: NCAT. EOMI. MMM. Anicteric sclerae.  Neck: Supple. No LAN, JVD, or carotid bruit. Heart: RRR. Normal S1, S2. No M/R/G. Nondisplaced PMI Lungs: CTAB. Nonlabored. Normal effort. Good air movement. No wheezes, rales, or rhonchi.  Abdomen: Mild truncal obesity but otherwise soft/NT/ND/NABS. No HSM.  Extremities: No C/C/E. 2+equal pulses throughout.   [id number] today: Yes Rate: 44 , Rhythm: Profound sinus bradycardia, otherwise normal ECG.  Recent Labs: None for this year.  ASSESSMENT / PLAN: CAD (coronary artery disease) Stable. No angina or CHF symptoms. We had switched from Effient to Plavix & subsequently full dose aspirin for prophylaxis. He is currently on beta blocker ACE inhibitor and statin.  Plan: Continue current medication  regimen.  Hyperlipidemia LDL goal < 70 Tolerating the combination of moderate dose Crestor and Lovaza. We checked labs today after his visit.  The results are as follows: Total cholesterol 147, triglycerides 155, HDL 35, LDL 79. Last recorded numbers I have are from 2 years ago. HDL is up from 31, could not calculate LDL due to triglycerides of 576, and total cholesterol is 263 -- outstanding results from medication coverage as well as his dramatic weight loss, diet and exercise.  Plan: Continue current medication regimen. Encouraged continued diet an exercise regimen.  Hypertension Excellent blood pressure control. However he is bradycardic on the 25 twice a  day of metoprolol with a heart rate in the 40s.  Plan: Decrease his metoprolol to 12/2 twice a day and increase his lisinopril to 10 mg daily.  Obesity (BMI 30.0-34.9) Not unexpectedly, he sort of a plateau of weight loss. He was able to be down about 10 pounds lower years now. He just is down a little bit "lax on his die"t. Is not quite the same intensity of exercises that he used to do.  Plan: I encouraged him to continue to go forward. He was doing great well I do want him to get discouraged. His dramatically improves himself. His target weight loss goal now is only 24 pounds this year.  Diabetes mellitus type 2 in obese His blood sugar today was 1:30. But notably improved from before. Dr. Ronne Binning has stopped his a diabetic regimen.    Orders Placed This Encounter  Procedures  . Comprehensive metabolic panel    Order Specific Question:  Has the patient fasted?    Answer:  Yes  . Lipid panel    Order Specific Question:  Has the patient fasted?    Answer:  Yes  . EKG 12-Lead   Meds ordered this encounter  Medications  . omega-3 acid ethyl esters (LOVAZA) 1 G capsule    Sig: Take 2 capsules (2 g total) by mouth 2 (two) times daily.    Dispense:  30 capsule    Refill:  11  . rosuvastatin (CRESTOR) 10 MG tablet    Sig:  Take 1 tablet (10 mg total) by mouth daily.    Dispense:  30 tablet    Refill:  11  . metoprolol tartrate (LOPRESSOR) 25 MG tablet    Sig: Take 1/2 tablet twice a day    Dispense:  30 tablet    Refill:  11  . lisinopril (PRINIVIL,ZESTRIL) 10 MG tablet    Sig: Take 1 tablet (10 mg total) by mouth daily.    Dispense:  30 tablet    Refill:  11   Followup: 1 yr  Katara Griner W. Herbie Baltimore, M.D., M.S. [facility] [address] [place], [place]  [address]  [phone] Pager # [phone]

## 2013-01-05 NOTE — Patient Instructions (Addendum)
Decrease Metoprolol tart 25 mg   Take 1/2 tablet twice a day Increase Lisinopril to 10 mg once a day   Labs lipid,CMP    Your physician wants you to follow-up in 12 month Dr Herbie Baltimore. You will receive a reminder letter in the mail two months in advance. If you don't receive a letter, please call our office to schedule the follow-up appointment.

## 2013-01-05 NOTE — Assessment & Plan Note (Signed)
Excellent blood pressure control. However he is bradycardic on the 25 twice a day of metoprolol with a heart rate in the 40s.  Plan: Decrease his metoprolol to 12/2 twice a day and increase his lisinopril to 10 mg daily.

## 2013-01-05 NOTE — Assessment & Plan Note (Signed)
His blood sugar today was 1:30. But notably improved from before. Dr. Ronne Binning has stopped his a diabetic regimen.

## 2013-01-05 NOTE — Assessment & Plan Note (Signed)
Tolerating the combination of moderate dose Crestor and Lovaza. We checked labs today after his visit.  The results are as follows: Total cholesterol 147, triglycerides 155, HDL 35, LDL 79. Last recorded numbers I have are from 2 years ago. HDL is up from 31, could not calculate LDL due to triglycerides of 576, and total cholesterol is 263 -- outstanding results from medication coverage as well as his dramatic weight loss, diet and exercise.  Plan: Continue current medication regimen. Encouraged continued diet an exercise regimen.

## 2013-01-07 ENCOUNTER — Telehealth: Payer: Self-pay | Admitting: *Deleted

## 2013-01-07 NOTE — Telephone Encounter (Signed)
Left message on voicemail  That results will be release to his My Chart.

## 2013-01-07 NOTE — Telephone Encounter (Signed)
Message copied by Tobin Chad on Wed Jan 07, 2013 12:13 PM ------      Message from: [facility], DAVID      Created: Mon Jan 05, 2013  9:24 PM       Excellent Lipids - --       TC down from 263 (2 yr ago) to 147!!!; LDL 79 now (could not calculate 2 yr ago  -- b/c TG 576 & now 165!!; HDL up from 31 to 35!!.            Keep it up!! -- the diet & exercise + low dose Cresto has him almost at goal!!            Marykay Lex, MD       ------

## 2013-03-19 ENCOUNTER — Other Ambulatory Visit: Payer: Self-pay

## 2013-06-08 ENCOUNTER — Telehealth: Payer: Self-pay | Admitting: *Deleted

## 2013-06-08 MED ORDER — ATORVASTATIN CALCIUM 20 MG PO TABS: 20.0000 mg | ORAL_TABLET | Freq: Every day | ORAL | Status: DC

## 2013-06-08 NOTE — Telephone Encounter (Signed)
Left message on voice mail-   Per Dr Ellyn Hack. crestor will be switched to  atorvastatin (lipitor)  20 mg at bedtime/or evening. PER [employer]- insurance will not cover CRESTOR ,but zocor,lipitor,or pravachol.

## 2013-06-11 ENCOUNTER — Telehealth: Payer: Self-pay | Admitting: *Deleted

## 2013-06-11 NOTE — Telephone Encounter (Signed)
Prior authorization - [id number]  SPOKE TO [employer].- OVER THE PHONE QUESTIONNAIRE GIVEN.  Rep states the  LOVAZA - GENERIC  Has been approved until 06/10/2016.  CALLED AND NOTIFIED [employer]---spoke to [facility].

## 2013-12-19 IMAGING — CT CT ABD-PELV W/O CM
2 of 4 series · 17 of 46 positions shown, 19 images · non-contrast
Comparison: None

CLINICAL DATA: Left side abdominal pain

CT ABDOMEN AND PELVIS WITHOUT CONTRAST
TECHNIQUE: Multidetector CT imaging of the abdomen and pelvis was
performed following the standard protocol without intravenous
contrast. Sagittal and coronal MPR images reconstructed from axial
data set.

[Series 2: renal stone > 200 lbs 5.0 b31f · axial · 0.87mm/px · z∈[+746,+1216]mm · 14 of 104 slices shown, 16 images]
[im 5/104  soft-tissue]
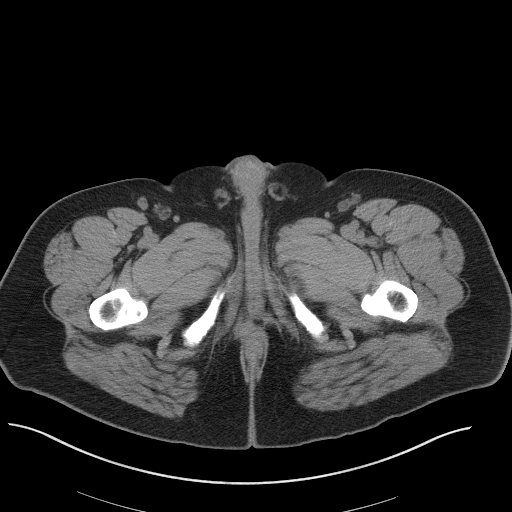
[im 5/104  bone]
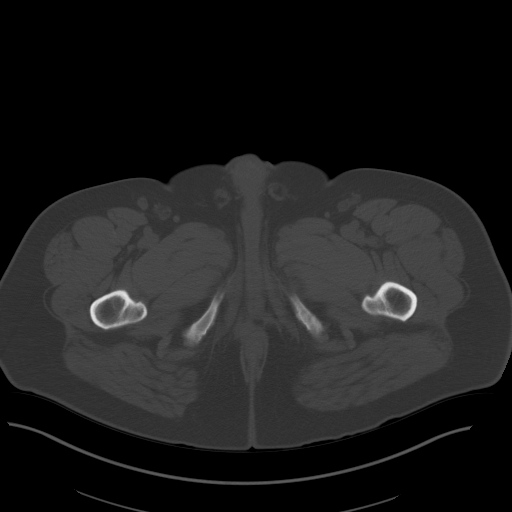
[im 13/104  soft-tissue]
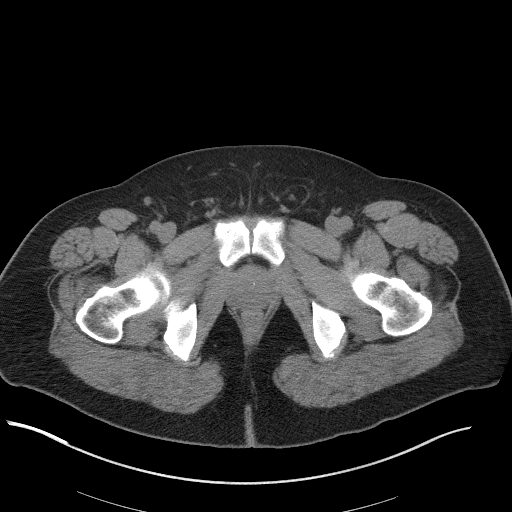
[im 22/104  soft-tissue]
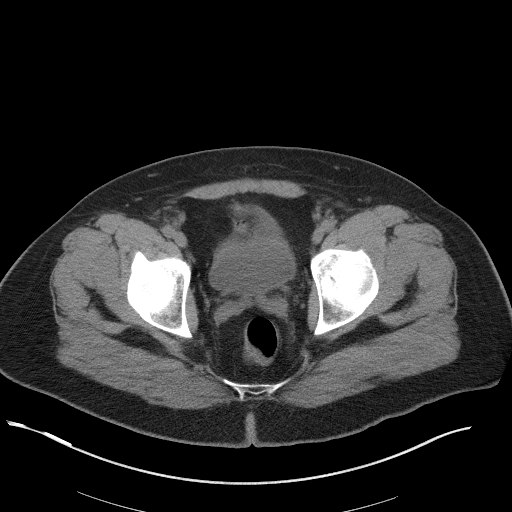
[im 26/104  soft-tissue]
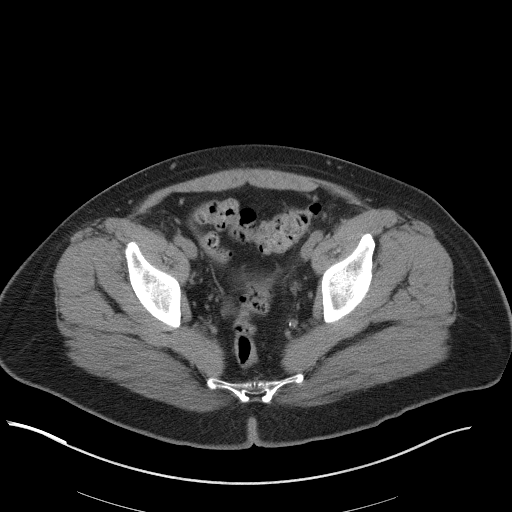
[im 35/104  soft-tissue]
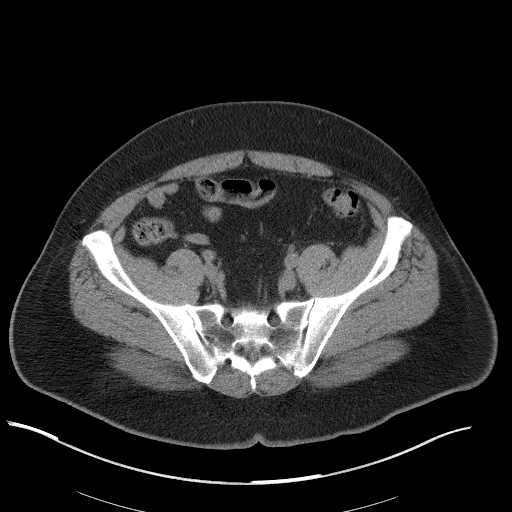
[im 43/104  soft-tissue]
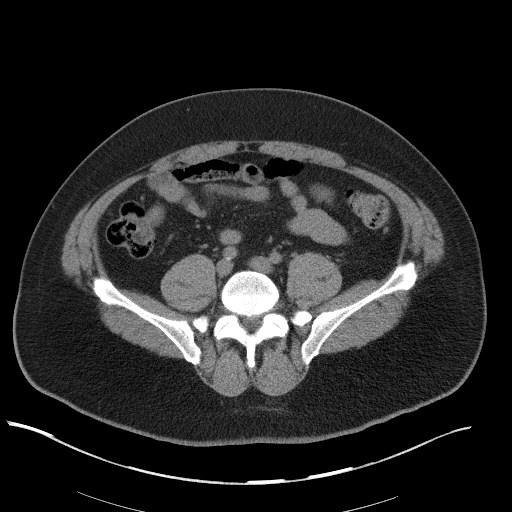
[im 48/104  soft-tissue]
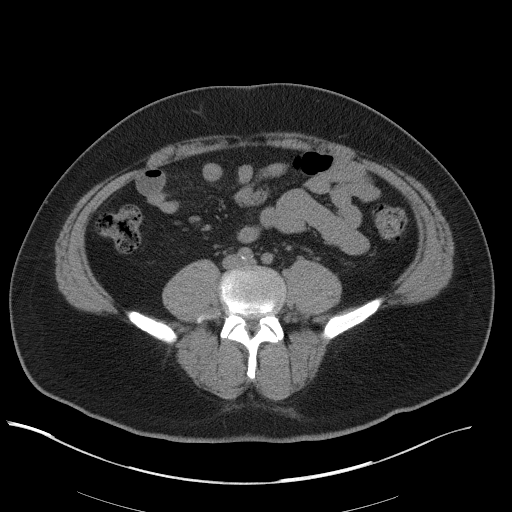
[im 56/104  soft-tissue]
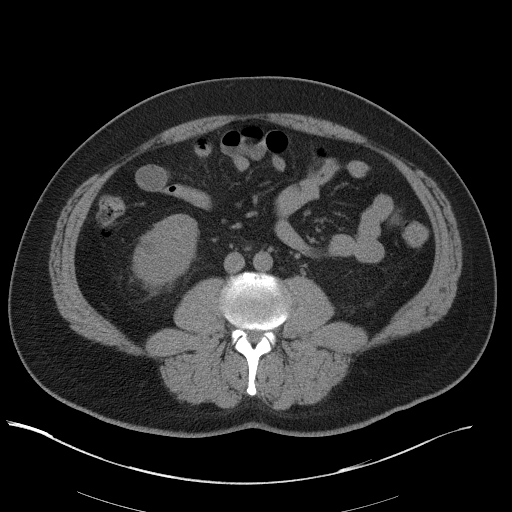
[im 61/104  soft-tissue]
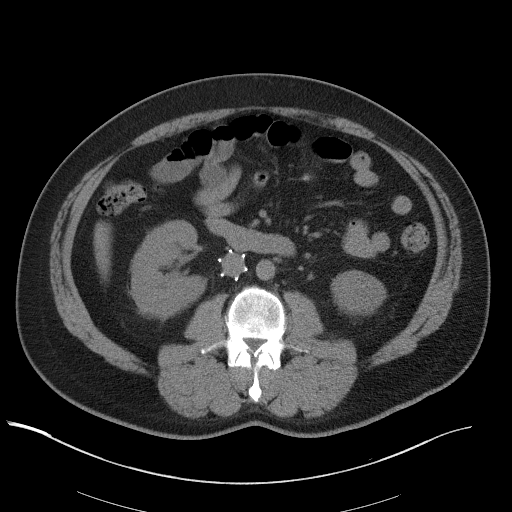
[im 61/104  bone]
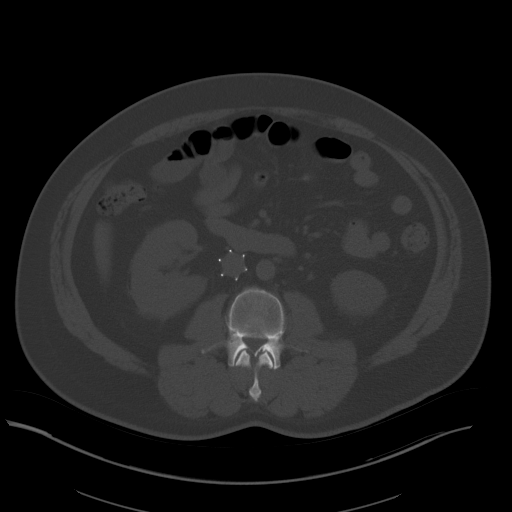
[im 69/104  soft-tissue]
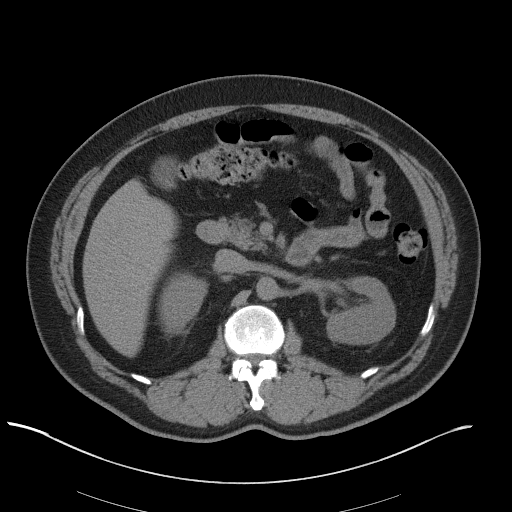
[im 78/104  soft-tissue]
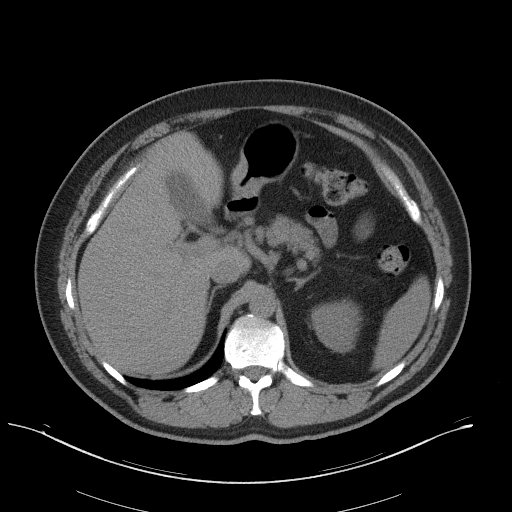
[im 82/104  soft-tissue]
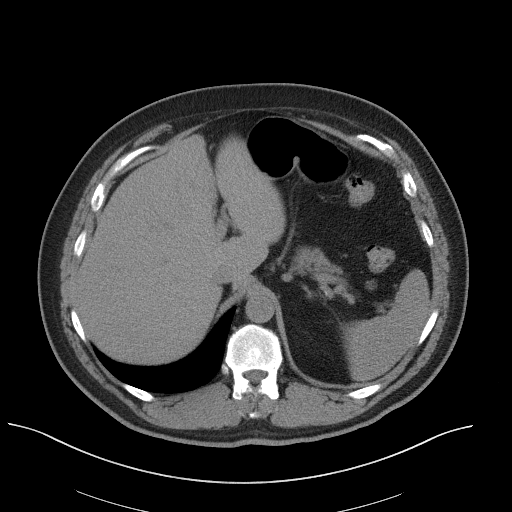
[im 91/104  soft-tissue]
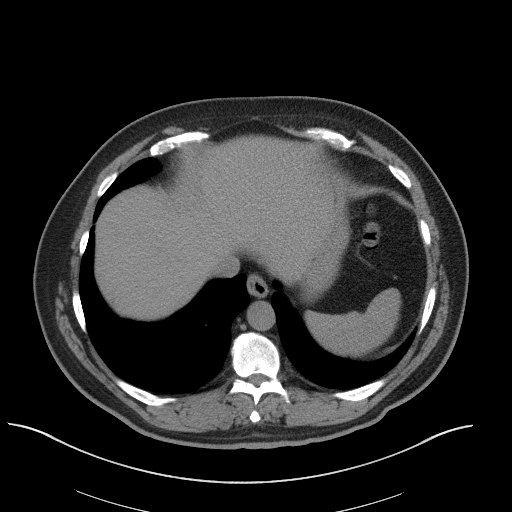
[im 99/104  soft-tissue]
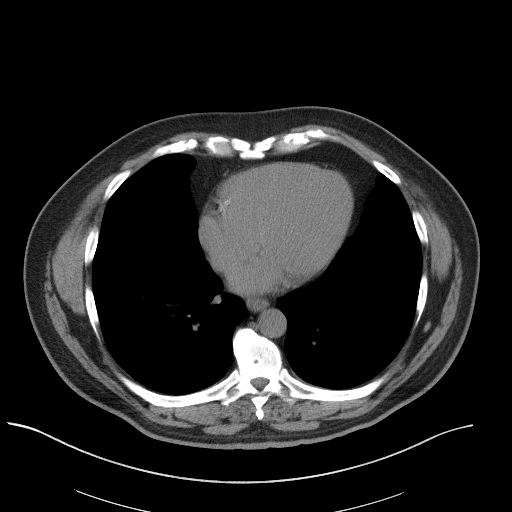

[Series 5: renal stone 3.0 coronal · coronal · 0.96mm/px · 3 of 109 slices shown]
[im 37/109  soft-tissue]
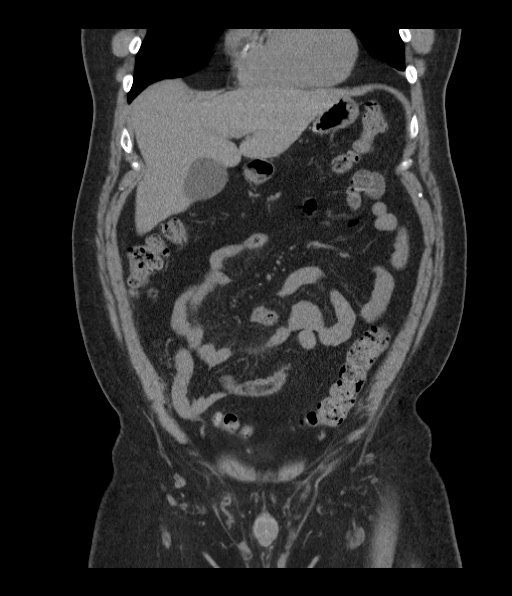
[im 49/109  soft-tissue]
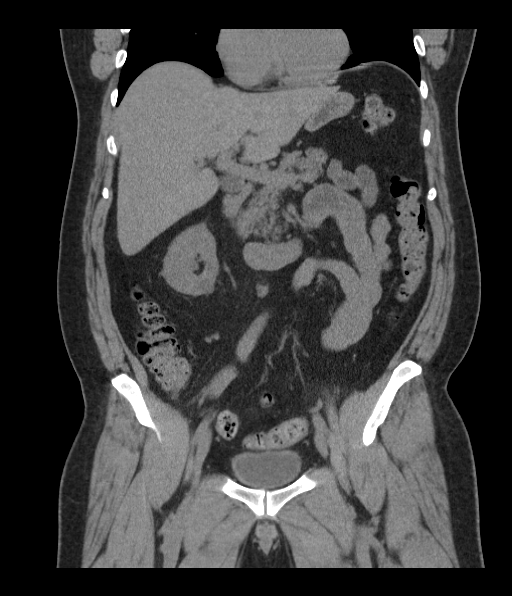
[im 61/109  soft-tissue]
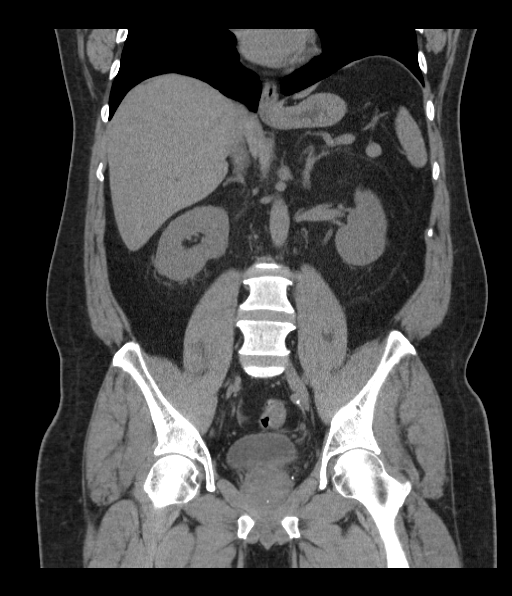

[17 of 46 positions shown; findings below may reference images not displayed]

FINDINGS: Lung bases clear.
IVC filter noted.
Tiny nonobstructing calculus left kidney image 32.
Probable small cysts left kidney images 37 and 39.
No additional urinary tract calcification, hydronephrosis, or
ureteral dilatation.
Bladder decompressed.
Minimal prostatic enlargement.
Tiny umbilical hernia containing fat.

Within limits of a nonenhanced exam no focal abnormalities of the
liver, spleen, pancreas, adrenal glands, or right kidney.
Small splenule at splenic hilum.
Minimal cysts descending colonic diverticulosis without evidence of
diverticulitis. Medication tablets within bowel.
Normal appendix coiled adjacent to cecal tip.
Stomach and bowel loops otherwise normal appearance.
No mass, adenopathy, free fluid, or inflammatory process.
No acute osseous findings.
Bulging discs at L3-L4 L4-L5
IMPRESSION: Tiny nonobstructing calculus and probable tiny cyst left kidney.
No definite hydronephrosis or ureteral dilatation.
Minimal colonic diverticulosis.
Tiny umbilical hernia containing fat.

## 2013-12-31 ENCOUNTER — Other Ambulatory Visit: Payer: Self-pay | Admitting: Cardiology

## 2013-12-31 NOTE — Telephone Encounter (Signed)
Rx was sent to pharmacy electronically. 

## 2014-03-17 ENCOUNTER — Other Ambulatory Visit: Payer: Self-pay | Admitting: Cardiology

## 2014-03-18 NOTE — Telephone Encounter (Signed)
Rx refill sent to patient pharmacy   

## 2014-04-20 ENCOUNTER — Other Ambulatory Visit: Payer: Self-pay | Admitting: Cardiology

## 2014-04-20 NOTE — Telephone Encounter (Signed)
Rx was sent to pharmacy electronically. 

## 2014-05-12 ENCOUNTER — Other Ambulatory Visit: Payer: Self-pay | Admitting: Cardiology

## 2014-05-12 NOTE — Telephone Encounter (Signed)
[place]  PATIENT HAS AN APPOINTMENT  FOR MARCH 2016

## 2014-06-15 ENCOUNTER — Other Ambulatory Visit: Payer: Self-pay | Admitting: Cardiology

## 2014-06-15 NOTE — Telephone Encounter (Signed)
Rx(s) sent to pharmacy electronically.  

## 2014-07-12 ENCOUNTER — Other Ambulatory Visit: Payer: Self-pay | Admitting: Cardiology

## 2014-07-13 ENCOUNTER — Encounter: Payer: Self-pay | Admitting: Cardiology

## 2014-07-13 ENCOUNTER — Ambulatory Visit (INDEPENDENT_AMBULATORY_CARE_PROVIDER_SITE_OTHER): Payer: BLUE CROSS/BLUE SHIELD | Admitting: Cardiology

## 2014-07-13 VITALS — BP 132/74 | HR 53 | Ht 70.0 in | Wt 254.1 lb

## 2014-07-13 DIAGNOSIS — I2510 Atherosclerotic heart disease of native coronary artery without angina pectoris: Secondary | ICD-10-CM

## 2014-07-13 DIAGNOSIS — E785 Hyperlipidemia, unspecified: Secondary | ICD-10-CM

## 2014-07-13 DIAGNOSIS — E119 Type 2 diabetes mellitus without complications: Secondary | ICD-10-CM

## 2014-07-13 DIAGNOSIS — E669 Obesity, unspecified: Secondary | ICD-10-CM

## 2014-07-13 DIAGNOSIS — E1169 Type 2 diabetes mellitus with other specified complication: Secondary | ICD-10-CM

## 2014-07-13 DIAGNOSIS — Z9861 Coronary angioplasty status: Secondary | ICD-10-CM

## 2014-07-13 DIAGNOSIS — Z79899 Other long term (current) drug therapy: Secondary | ICD-10-CM

## 2014-07-13 DIAGNOSIS — I10 Essential (primary) hypertension: Secondary | ICD-10-CM

## 2014-07-13 LAB — COMPREHENSIVE METABOLIC PANEL WITH GFR
ALT: 43 U/L (ref 0–53)
AST: 24 U/L (ref 0–37)
Albumin: 4.2 g/dL (ref 3.5–5.2)
Alkaline Phosphatase: 80 U/L (ref 39–117)
BUN: 16 mg/dL (ref 6–23)
CO2: 27 meq/L (ref 19–32)
Calcium: 8.9 mg/dL (ref 8.4–10.5)
Chloride: 100 meq/L (ref 96–112)
Creat: 0.9 mg/dL (ref 0.50–1.35)
Glucose, Bld: 130 mg/dL — ABNORMAL HIGH (ref 70–99)
Potassium: 4.7 meq/L (ref 3.5–5.3)
Sodium: 137 meq/L (ref 135–145)
Total Bilirubin: 0.3 mg/dL (ref 0.2–1.2)
Total Protein: 6.9 g/dL (ref 6.0–8.3)

## 2014-07-13 LAB — LIPID PANEL
Cholesterol: 182 mg/dL (ref 0–200)
HDL: 39 mg/dL — ABNORMAL LOW
LDL Cholesterol: 118 mg/dL — ABNORMAL HIGH (ref 0–99)
Total CHOL/HDL Ratio: 4.7 ratio
Triglycerides: 123 mg/dL
VLDL: 25 mg/dL (ref 0–40)

## 2014-07-13 MED ORDER — METOPROLOL TARTRATE 25 MG PO TABS: 12.5000 mg | ORAL_TABLET | Freq: Two times a day (BID) | ORAL | Status: DC

## 2014-07-13 MED ORDER — ATORVASTATIN CALCIUM 20 MG PO TABS: 20.0000 mg | ORAL_TABLET | Freq: Every day | ORAL | Status: DC

## 2014-07-13 MED ORDER — LISINOPRIL 10 MG PO TABS: 10.0000 mg | ORAL_TABLET | Freq: Every day | ORAL | Status: DC

## 2014-07-13 MED ORDER — OMEGA-3-ACID ETHYL ESTERS 1 G PO CAPS
2.0000 | ORAL_CAPSULE | Freq: Two times a day (BID) | ORAL | Status: DC
Start: 2014-07-13 — End: 2014-12-17

## 2014-07-13 NOTE — Patient Instructions (Addendum)
Continue current medication  Labs cmp , lipid  Your physician wants you to follow-up in 12 month Dr Ellyn Hack.  You will receive a reminder letter in the mail two months in advance. If you don't receive a letter, please call our office to schedule the follow-up appointment.

## 2014-07-15 ENCOUNTER — Encounter: Payer: Self-pay | Admitting: Cardiology

## 2014-07-15 DIAGNOSIS — Z7901 Long term (current) use of anticoagulants: Secondary | ICD-10-CM | POA: Insufficient documentation

## 2014-07-15 DIAGNOSIS — Z79899 Other long term (current) drug therapy: Secondary | ICD-10-CM | POA: Insufficient documentation

## 2014-07-15 NOTE — Assessment & Plan Note (Signed)
Excellent blood pressure control. Heart rate is improved compared to last visit. Cannot increase beta blocker beyond 12.5 twice a day. Stable with 10 mg lisinopril for now.

## 2014-07-15 NOTE — Progress Notes (Signed)
PCP: Thressa Sheller, MD  Clinic Note: Chief Complaint  Patient presents with  . 52 MONTH VISIT    NO CHEST PAIN , NO SOB, NO EDEMA- NO LABS THIS YEAR FROM PCP  . Coronary Artery Disease    Multivessel PCI    HPI: Gregory Gregory is a 59 y.o. male with a PMH below who presents today for delayed annual follow-up of his multivessel CAD status post extensive PCI as described below. Due to scheduling conflicts, he is now 18 months follow-up.  Past Medical History  Diagnosis Date  . NSTEMI (non-ST elevated myocardial infarction) 10/2010    multivessel PCI  . CAD (coronary artery disease) 10/2010    multivessel PCI RCA; OM1; LAD;diag  . Presence of drug coated stent in anterior descending branch of left coronary artery June 2012    Resolute DES stents: LAD- 2.75 mm x 14 mm -- 3.3 mm; Diag - 2.25 mm x 12 mm -- 2.3 mm  . Presence of drug coated stent in left circumflex coronary artery June 2012    OM1 -- 2 overlapping Resolute DES Stents: 2.25 mm 40mm, x 74m  . Presence of drug coated stent in right coronary artery June 2012    Mid RCA 2.75 mm x 22 mm  . H/O echocardiogram     EF 40-98%; grade 1 diastolic dysfunction; trivial TR  . Hypertension   . Diabetes mellitus type 2, controlled     No longer on Medcations after weight loss.  . Obesity (BMI 30.0-34.9)     Lost  > 80 lb since NSTEMI  . Hyperlipidemia LDL goal < 70     Prior Cardiac Evaluation and Past Surgical History: Past Surgical History  Procedure Laterality Date  . Coronary stent placement  11/01/12    PCI to mid LAD with integrity resolute DES; PCI to proximal second diagonal branch with resolute integrity  . Coronary angioplasty with stent placement  11/02/2012    PCI of mid RCA and proximal OM 1 with Resolute DESs  . Orif femur fracture Right    Interval History: [place] is doing very well. He continues to remain active and exercises daily. He does the recumbent bicycle at least 3 times a week and walks a couple miles  and does that every daily. He has been a little less rigid with his diet and has gained a little weight since his last visit but is energy level remains stable. He denies any resting or exertional chest tightness or pressure. No dyspnea with rest or exertion. He denies any heart failure symptoms of PND, orthopnea or edema. No palpitations, lightheadedness, dizziness, weakness, syncope/near syncope or TIA/amaurosis fugax symptoms.  ROS: A comprehensive was performed. Review of Systems  Constitutional: Negative for malaise/fatigue.  Respiratory: Negative for cough, shortness of breath and wheezing.   Cardiovascular: Negative for claudication.  Gastrointestinal: Negative for blood in stool and melena.  Genitourinary: Negative for hematuria.  Endo/Heme/Allergies: Bruises/bleeds easily.  Psychiatric/Behavioral: Negative for depression. The patient is not nervous/anxious.   All other systems reviewed and are negative.   Current Outpatient Prescriptions on File Prior to Visit  Medication Sig Dispense Refill  . aspirin 325 MG EC tablet Take 325 mg by mouth daily.     No current facility-administered medications on file prior to visit.   Allergies  Allergen Reactions  . Bee Venom    SOCIAL AND FAMILY HISTORY REVIEWED IN EPIC -- No change  Wt Readings from Last 3 Encounters:  07/13/14 254 lb 1.6 oz (  115.259 kg)  01/05/13 246 lb 1.6 oz (111.63 kg)    PHYSICAL EXAM BP 132/74 mmHg  Pulse 53  Ht 5\' 10"  (1.778 m)  Wt 254 lb 1.6 oz (115.259 kg)  BMI 36.46 kg/m2 General: Healthy-appearing gentleman. He continues to be in a very good mood, pleasant mood and affect. HEENT: NCAT. EOMI. MMM. Anicteric sclerae.  Neck: Supple. No LAN, JVD, or carotid bruit. Heart: RRR. Normal S1, S2. No M/R/G. Nondisplaced PMI Lungs: CTAB. Nonlabored. Normal effort. Good air movement. No wheezes, rales, or rhonchi.  Abdomen: Mild truncal obesity but otherwise soft/NT/ND/NABS. No HSM.  Extremities: No C/C/E.  2+equal pulses throughout.   Adult ECG Report  Rate: 53 ;  Rhythm: sinus bradycardia and Otherwise normal EKG  Narrative Interpretation: Stable sinus bradycardia  Recent Labs:  n/a   ASSESSMENT / PLAN: CAD S/P percutaneous coronary angioplasty Stable without any anginal symptoms. He remains fully active. No longer on Effient. He is on aspirin alone. No angina or heart failure symptoms. Plan: Continue her blocker, ACE inhibitor, statin and aspirin.   Hyperlipidemia with target LDL less than 70 He seems to be tolerating the combination of Lovaza and atorvastatin. He had been on Crestor in the past. Currently now switched to atorvastatin for insurance issues.  Has not had labs checked since 2014. Plan: Check lipid panel with Chem 10 for LFTs and chemistries (he is on ACE inhibitor)  Adjust the statin dose according to results.   Mild essential hypertension Excellent blood pressure control. Heart rate is improved compared to last visit. Cannot increase beta blocker beyond 12.5 twice a day. Stable with 10 mg lisinopril for now.   Obesity (BMI 30.0-34.9) He has done pretty well maintaining his post-MI weight loss. He did gain about 8 pounds back from 18 months ago. I talked about the importance of getting back on a more strict diet. Continue current exercise regimen.   Diabetes mellitus type 2 in obese He is no longer on any diabetic medications. Defer to PCP.   Encounter for long-term (current) use of medications Checking Chem 10 and Lipid Panel - hypertension on ACE inhibitor, on statin.     Orders Placed This Encounter  Procedures  . Lipid panel    Order Specific Question:  Has the patient fasted?    Answer:  Yes  . Comprehensive metabolic panel    Order Specific Question:  Has the patient fasted?    Answer:  Yes  . EKG 12-Lead   Meds ordered this encounter  Medications  . metoprolol tartrate (LOPRESSOR) 25 MG tablet    Sig: Take 0.5 tablets (12.5 mg total) by  mouth 2 (two) times daily.    Dispense:  30 tablet    Refill:  11    PLACE ON FILE  . lisinopril (PRINIVIL,ZESTRIL) 10 MG tablet    Sig: Take 1 tablet (10 mg total) by mouth daily.    Dispense:  30 tablet    Refill:  11    PLACE ON FILE  . atorvastatin (LIPITOR) 20 MG tablet    Sig: Take 1 tablet (20 mg total) by mouth daily.    Dispense:  30 tablet    Refill:  11  . omega-3 acid ethyl esters (LOVAZA) 1 G capsule    Sig: Take 2 capsules (2 g total) by mouth 2 (two) times daily.    Dispense:  60 capsule    Refill:  11    Followup: One year   HARDING, Leonie Green, M.D., M.S. Interventional  Cardiologist   Pager # [phone]

## 2014-07-15 NOTE — Assessment & Plan Note (Signed)
Stable without any anginal symptoms. He remains fully active. No longer on Effient. He is on aspirin alone. No angina or heart failure symptoms. Plan: Continue her blocker, ACE inhibitor, statin and aspirin.

## 2014-07-15 NOTE — Assessment & Plan Note (Signed)
He is no longer on any diabetic medications. Defer to PCP.

## 2014-07-15 NOTE — Assessment & Plan Note (Signed)
He seems to be tolerating the combination of Lovaza and atorvastatin. He had been on Crestor in the past. Currently now switched to atorvastatin for insurance issues.  Has not had labs checked since 2014. Plan: Check lipid panel with Chem 10 for LFTs and chemistries (he is on ACE inhibitor)  Adjust the statin dose according to results.

## 2014-07-15 NOTE — Assessment & Plan Note (Signed)
He has done pretty well maintaining his post-MI weight loss. He did gain about 8 pounds back from 18 months ago. I talked about the importance of getting back on a more strict diet. Continue current exercise regimen.

## 2014-07-15 NOTE — Assessment & Plan Note (Signed)
Checking Chem 10 and Lipid Panel - hypertension on ACE inhibitor, on statin.

## 2014-07-21 ENCOUNTER — Telehealth: Payer: Self-pay | Admitting: *Deleted

## 2014-07-21 NOTE — Telephone Encounter (Signed)
Left message tocall back -in regards to lab result 

## 2014-07-21 NOTE — Telephone Encounter (Signed)
-----   Message from Leonie Man, MD sent at 07/19/2014  7:08 AM EST ----- We will need to recheck lipids in ~3-4 months after increasing the dose.  [place]

## 2014-09-23 ENCOUNTER — Encounter: Payer: Self-pay | Admitting: *Deleted

## 2014-09-23 ENCOUNTER — Telehealth: Payer: Self-pay | Admitting: *Deleted

## 2014-09-23 DIAGNOSIS — E669 Obesity, unspecified: Secondary | ICD-10-CM

## 2014-09-23 DIAGNOSIS — I2510 Atherosclerotic heart disease of native coronary artery without angina pectoris: Secondary | ICD-10-CM

## 2014-09-23 DIAGNOSIS — Z79899 Other long term (current) drug therapy: Secondary | ICD-10-CM

## 2014-09-23 DIAGNOSIS — E785 Hyperlipidemia, unspecified: Secondary | ICD-10-CM

## 2014-09-23 DIAGNOSIS — Z9861 Coronary angioplasty status: Secondary | ICD-10-CM

## 2014-09-23 MED ORDER — ATORVASTATIN CALCIUM 40 MG PO TABS: 40.0000 mg | ORAL_TABLET | Freq: Every day | ORAL | Status: DC

## 2014-09-23 NOTE — Telephone Encounter (Signed)
Mail letter and lab slip, and prescription for Atorvastatin

## 2014-09-23 NOTE — Telephone Encounter (Signed)
Mailed letter with results and instruction

## 2014-12-17 ENCOUNTER — Other Ambulatory Visit: Payer: Self-pay | Admitting: Cardiology

## 2014-12-17 NOTE — Telephone Encounter (Signed)
Rx(s) sent to pharmacy electronically.  

## 2015-01-20 ENCOUNTER — Telehealth: Payer: Self-pay | Admitting: *Deleted

## 2015-01-20 DIAGNOSIS — Z9861 Coronary angioplasty status: Secondary | ICD-10-CM

## 2015-01-20 DIAGNOSIS — I2510 Atherosclerotic heart disease of native coronary artery without angina pectoris: Secondary | ICD-10-CM

## 2015-01-20 DIAGNOSIS — Z79899 Other long term (current) drug therapy: Secondary | ICD-10-CM

## 2015-01-20 DIAGNOSIS — E669 Obesity, unspecified: Secondary | ICD-10-CM

## 2015-01-20 DIAGNOSIS — E785 Hyperlipidemia, unspecified: Secondary | ICD-10-CM

## 2015-01-20 NOTE — Telephone Encounter (Signed)
Mail letter and labslip  

## 2015-01-20 NOTE — Telephone Encounter (Signed)
-----   Message from Raiford Simmonds, RN sent at 09/23/2014 10:23 AM EDT ----- Lipids and labs  In Oct 2016 Mail in [place] 2016

## 2015-02-23 LAB — COMPREHENSIVE METABOLIC PANEL
ALT: 34 U/L (ref 9–46)
AST: 20 U/L (ref 10–35)
Albumin: 4 g/dL (ref 3.6–5.1)
Alkaline Phosphatase: 79 U/L (ref 40–115)
BUN: 16 mg/dL (ref 7–25)
CHLORIDE: 102 mmol/L (ref 98–110)
CO2: 29 mmol/L (ref 20–31)
Calcium: 9.5 mg/dL (ref 8.6–10.3)
Creat: 0.87 mg/dL (ref 0.70–1.33)
GLUCOSE: 138 mg/dL — AB (ref 65–99)
POTASSIUM: 4.7 mmol/L (ref 3.5–5.3)
SODIUM: 138 mmol/L (ref 135–146)
TOTAL PROTEIN: 7.2 g/dL (ref 6.1–8.1)
Total Bilirubin: 0.4 mg/dL (ref 0.2–1.2)

## 2015-02-23 LAB — LIPID PANEL
Cholesterol: 148 mg/dL (ref 125–200)
HDL: 32 mg/dL — AB (ref >=40)
LDL Cholesterol: 86 mg/dL (ref <130)
TRIGLYCERIDES: 151 mg/dL — AB (ref <150)
Total CHOL/HDL Ratio: 4.6 Ratio (ref <=5.0)
VLDL: 30 mg/dL (ref <30)

## 2015-02-24 ENCOUNTER — Encounter: Payer: Self-pay | Admitting: *Deleted

## 2015-02-24 ENCOUNTER — Telehealth: Payer: Self-pay | Admitting: *Deleted

## 2015-02-24 NOTE — Telephone Encounter (Signed)
-----   Message from Leonie Man, MD sent at 02/23/2015  7:47 PM EDT ----- Glucose is still up - but other chemistry panel data looks good - liver &kidneys.  Cholesterol looks much better - (pretty much like 2 yrs ago).    Leonie Man, MD

## 2015-02-24 NOTE — Telephone Encounter (Signed)
MAIL  RESULT LETTER PHONE NUMBER  STATES IT DISCONNECTED.

## 2015-04-18 ENCOUNTER — Other Ambulatory Visit: Payer: Self-pay | Admitting: Cardiology

## 2015-04-18 NOTE — Telephone Encounter (Signed)
REFILL 

## 2015-07-14 ENCOUNTER — Other Ambulatory Visit: Payer: Self-pay | Admitting: Cardiology

## 2015-07-15 ENCOUNTER — Other Ambulatory Visit: Payer: Self-pay | Admitting: Cardiology

## 2015-07-15 NOTE — Telephone Encounter (Signed)
Rx request sent to pharmacy.  

## 2015-07-28 ENCOUNTER — Encounter: Payer: Self-pay | Admitting: Cardiology

## 2015-07-28 ENCOUNTER — Ambulatory Visit (INDEPENDENT_AMBULATORY_CARE_PROVIDER_SITE_OTHER): Payer: BLUE CROSS/BLUE SHIELD | Admitting: Cardiology

## 2015-07-28 VITALS — BP 150/88 | HR 64 | Ht 71.0 in | Wt 257.0 lb

## 2015-07-28 DIAGNOSIS — R413 Other amnesia: Secondary | ICD-10-CM

## 2015-07-28 DIAGNOSIS — I214 Non-ST elevation (NSTEMI) myocardial infarction: Secondary | ICD-10-CM | POA: Diagnosis not present

## 2015-07-28 DIAGNOSIS — I2510 Atherosclerotic heart disease of native coronary artery without angina pectoris: Secondary | ICD-10-CM

## 2015-07-28 DIAGNOSIS — Z79899 Other long term (current) drug therapy: Secondary | ICD-10-CM | POA: Diagnosis not present

## 2015-07-28 DIAGNOSIS — E785 Hyperlipidemia, unspecified: Secondary | ICD-10-CM | POA: Diagnosis not present

## 2015-07-28 DIAGNOSIS — Z9861 Coronary angioplasty status: Secondary | ICD-10-CM

## 2015-07-28 DIAGNOSIS — E669 Obesity, unspecified: Secondary | ICD-10-CM

## 2015-07-28 DIAGNOSIS — I10 Essential (primary) hypertension: Secondary | ICD-10-CM

## 2015-07-28 DIAGNOSIS — E66811 Obesity, class 1: Secondary | ICD-10-CM

## 2015-07-28 MED ORDER — METOPROLOL TARTRATE 25 MG PO TABS: 25.0000 mg | ORAL_TABLET | Freq: Two times a day (BID) | ORAL | Status: DC

## 2015-07-28 NOTE — Patient Instructions (Signed)
Dr Ellyn Hack has recommended making the following medication changes: INCREASE Metoprolol (Lopressor) to 25 mg - take a whole tablet twice daily  >>An updated prescription has been sent to your pharmacy electronically  Your physician recommends that you return for lab work in 2 weeks. Please come to the building to have your blood work done. Dr Ellyn Hack recommends that you schedule a nurse visit to have your blood pressure taken on the same day. **Please hold your Atorvastatin (Lipitor) for 1 month starting the day you have your blood work done. Please call back at the end of the month and speak with a nurse about memory issues.  Dr Ellyn Hack recommends that you schedule a follow-up appointment in 1 year. You will receive a reminder letter in the mail two months in advance. If you don't receive a letter, please call our office to schedule the follow-up appointment.  If you need a refill on your cardiac medications before your next appointment, please call your pharmacy.

## 2015-07-28 NOTE — Progress Notes (Signed)
PCP: Thressa Sheller, MD  Clinic Note: Chief Complaint  Patient presents with  . Annual Exam    no chest pain, occassional shortness of breath with exercise, occassional edema with standing, no pain or cramping in legs, occassional lightheadedness or dizziness, occassional fatigue  . Coronary Artery Disease    Multivessel PCI    HPI: Sinjin Liu is a 60 y.o. male with a PMH below who presents today for annual follow-up of CAD with multivessel PCI.Marland Kitchen He had non-STEMI in 2012 and was found to have multivessel disease -- he opted for multivessel PCI or CABG: Had PCI the RCA OM1 and LAD as well as diagonal branch. Normal EF. He has diet controlled diabetes type 2 as well as hypertension, hyperlipidemia and obesity.  Laquavious Karpowicz was last seen on 07/15/2014, and was doing very well without any major complaints. He was working out using the recumbent bicycle 3 days a week as well as walking several miles a day.  Recent Hospitalizations: None  Studies Reviewed: None  Interval History: Presents for his routine annual follow-up doing quite well. He has no major complaints. Just that he has gotten off of his more controlled diet and has let his weight drift back up. He is still active and still exercises, but is been less diligent about his diet. He also I guess admits later on that he is a little more slack when he goes to his exercise and he had been. He is concerned about some memory issues that is been having. He doesn't have any myalgias but is concerned that he is a large ovary memory people and places him that he should know. As far as cardiac symptoms go he is relatively asymptomatic and is able to do whatever he wants to without any symptoms. He feels like his strength was completely restored following his multivessel PCI but never really got back to a full level of stamina.  Cardiac review of symptoms as follows:  No chest pain or shortness of breath with rest or exertion.   No  PND, orthopnea or edema. No palpitations, lightheadedness, dizziness, weakness or syncope/near syncope.  No TIA/amaurosis fugax symptoms.  No melena, hematochezia, hematuria, or epstaxis.  No claudication.  ROS: A comprehensive was performed. Review of Systems  Constitutional: Negative for weight loss and malaise/fatigue.  HENT: Negative for nosebleeds.   Respiratory: Negative for cough, shortness of breath and wheezing.   Cardiovascular: Negative.        Per history of present illness  Genitourinary: Negative.   Musculoskeletal: Positive for joint pain (Usual arthritis type pains).  Neurological: Negative.  Negative for dizziness, sensory change, speech change, focal weakness, seizures, loss of consciousness and headaches.  Endo/Heme/Allergies: Bruises/bleeds easily.  Psychiatric/Behavioral: Negative.   All other systems reviewed and are negative.   Bruising  Past Medical History  Diagnosis Date  . NSTEMI (non-ST elevated myocardial infarction) ([place]) 10/2010    multivessel PCI  . CAD (coronary artery disease) 10/2010    multivessel PCI RCA; OM1; LAD;diag  . Presence of drug coated stent in anterior descending branch of left coronary artery June 2012    Resolute DES stents: LAD- 2.75 mm x 14 mm -- 3.3 mm; Diag - 2.25 mm x 12 mm -- 2.3 mm  . Presence of drug coated stent in left circumflex coronary artery June 2012    OM1 -- 2 overlapping Resolute DES Stents: 2.25 mm 10mm, x 57m  . Presence of drug coated stent in right coronary artery June 2012  Mid RCA 2.75 mm x 22 mm  . H/O echocardiogram     EF 0000000; grade 1 diastolic dysfunction; trivial TR  . Hypertension   . Diabetes mellitus type 2, controlled ([place])     No longer on Medcations after weight loss.  . Obesity (BMI 30.0-34.9)     Lost  > 80 lb since NSTEMI  . Hyperlipidemia LDL goal < 70     Past Surgical History  Procedure Laterality Date  . Coronary stent placement  11/02/10    PCI to mid LAD with integrity  resolute DES; PCI to proximal second diagonal branch with resolute integrity  . Coronary angioplasty with stent placement  11/03/2010    PCI of mid RCA and proximal OM 1 with Resolute DESs  . Orif femur fracture Right    Prior to Admission medications   Medication Sig Start Date End Date Taking? Authorizing Provider  aspirin 325 MG EC tablet Take 325 mg by mouth daily.   Yes Historical Provider, MD  atorvastatin (LIPITOR) 40 MG tablet Take 1 tablet (40 mg total) by mouth daily. 09/23/14  Yes Leonie Man, MD  lisinopril (PRINIVIL,ZESTRIL) 10 MG tablet TAKE ONE TABLET BY MOUTH ONCE DAILY 07/15/15  Yes Leonie Man, MD  metoprolol tartrate (LOPRESSOR) 25 MG tablet TAKE ONE-HALF TABLET BY MOUTH TWICE DAILY 07/15/15  Yes Leonie Man, MD  omega-3 acid ethyl esters (LOVAZA) 1 g capsule TAKE TWO CAPSULES BY MOUTH TWICE DAILY 07/15/15  Yes Leonie Man, MD    Allergies  Allergen Reactions  . Bee Venom     Social History   Social History  . Marital Status: Single    Spouse Name: N/A  . Number of Children: N/A  . Years of Education: N/A   Social History Main Topics  . Smoking status: Never Smoker   . Smokeless tobacco: None  . Alcohol Use: No  . Drug Use: No  . Sexual Activity: Not Asked   Other Topics Concern  . None   Social History Narrative   He is a divorced father of 50, grandfather 1. He now has a new long-term life partner, usually comes with him to appointments the ED his exercise routinely walking at least 2-3 miles a day and this case to 4 days a week. He also does him some strength and conditioning exercises as well.   He never was a smoker. He does not drink alcohol.   family history includes Cancer in his father, mother, and sister; Heart attack in his maternal grandfather.  Wt Readings from Last 3 Encounters:  07/28/15 257 lb (116.574 kg)  07/13/14 254 lb 1.6 oz (115.259 kg)  01/05/13 246 lb 1.6 oz (111.63 kg)    PHYSICAL EXAM BP 150/88 mmHg  Pulse 64  Ht  5\' 11"  (1.803 m)  Wt 257 lb (116.574 kg)  BMI 35.86 kg/m2 General: Healthy-appearing gentleman. He continues to be in a very good mood,  HEENT: NCAT. EOMI. MMM. Anicteric sclerae.  Neck: Supple. No LAN, JVD, or carotid bruit. Heart: RRR. Normal S1, S2. No M/R/G. Nondisplaced PMI Lungs: CTAB. Nonlabored. Normal effort. Good air movement. No wheezes, rales, or rhonchi.  Abdomen: Mild truncal obesity but otherwise soft/NT/ND/NABS. No HSM.  Extremities: No C/C/E. 2+equal pulses throughout. Neuro: A and O 3. Normal mood and affect. Cranial nerves grossly intact. Nonfocal    Adult ECG Report  Rate: 64 ;  Rhythm: normal sinus rhythm and Normal axis, intervals and durations.;   Narrative Interpretation: Stable  EKG   Other studies Reviewed: Additional studies/ records that were reviewed today include:  Recent Labs:   Lab Results  Component Value Date   CHOL 148 02/22/2015   HDL 32* 02/22/2015   LDLCALC 86 02/22/2015   TRIG 151* 02/22/2015   CHOLHDL 4.6 02/22/2015   Lab Results  Component Value Date   CREATININE 0.87 02/22/2015   Lab Results  Component Value Date   K 4.7 02/22/2015    ASSESSMENT / PLAN: Problem List Items Addressed This Visit    Obesity (BMI 30.0-34.9) (Chronic)    Steadily put on about 10 pounds from his nadir of 246 pounds. -- He is now just that is been less strict himself and he had been. "Time to refocus."      Mild essential hypertension (Chronic)    Interestingly, his blood pressure is higher today than usual. At home it usually runs in the 130/80 mmHg range. Plan: Increase Lopressor to 25 mg twice a day.      Relevant Medications   metoprolol tartrate (LOPRESSOR) 25 MG tablet   Memory loss of unknown cause    Need to determine whether this is related to her atorvastatin or not. Although we are checking his lipids. I want him to stop Lipitor after getting lipids checked. He will stop it for a month and then reassess his symptoms of it. We will  recheck with him in about a month. We may need to consider converting to rosuvastatin.      Hyperlipidemia with target LDL less than 70 (Chronic)    Not quite controlled based on labs in October. HDL continues to be low and LDL not quite at goal. We'll continue with 40 of Lipitor and recheck labs. Continue to push weight loss - with diet and most notably increased exercise.      Relevant Medications   metoprolol tartrate (LOPRESSOR) 25 MG tablet   History of: Non-ST elevation MI (NSTEMI) ([facility]) (Chronic)    No recurrent anginal type symptoms since his multivessel PCI.      Relevant Medications   metoprolol tartrate (LOPRESSOR) 25 MG tablet   CAD S/P percutaneous coronary angioplasty (Chronic)    Stable. No anginal symptoms. No longer on an antiplatelet agent beyond aspirin. Plan: Continue beta blocker, ACE inhibitor, aspirin and statin.      Relevant Medications   metoprolol tartrate (LOPRESSOR) 25 MG tablet   Other Relevant Orders   EKG 12-Lead (Completed)    Other Visit Diagnoses    Dyslipidemia    -  Primary    Relevant Orders    Lipid panel    Comprehensive metabolic panel    Medication management        Relevant Orders    Lipid panel    Comprehensive metabolic panel       Current medicines are reviewed at length with the patient today. (+/- concerns) None The following changes have been made:   Increase metoprolol to 25 mg twice a day  Check labs in 2 weeks.  Stop Lipitor for one month after labs done. --> Check back in with Trixie Dredge, RN after a month to determine change in memory   Studies Ordered:   Orders Placed This Encounter  Procedures  . Lipid panel  . Comprehensive metabolic panel  . EKG 12-Lead   Follow-up in one year     Addasyn Mcbreen, Leonie Green, M.D., M.S. Interventional Cardiologist   Pager # [phone] Phone # [phone] [address] [place], [place] [address]

## 2015-07-30 ENCOUNTER — Encounter: Payer: Self-pay | Admitting: Cardiology

## 2015-07-30 DIAGNOSIS — R413 Other amnesia: Secondary | ICD-10-CM | POA: Insufficient documentation

## 2015-07-30 NOTE — Assessment & Plan Note (Signed)
Not quite controlled based on labs in October. HDL continues to be low and LDL not quite at goal. We'll continue with 40 of Lipitor and recheck labs. Continue to push weight loss - with diet and most notably increased exercise.

## 2015-07-30 NOTE — Assessment & Plan Note (Signed)
Interestingly, his blood pressure is higher today than usual. At home it usually runs in the 130/80 mmHg range. Plan: Increase Lopressor to 25 mg twice a day.

## 2015-07-30 NOTE — Assessment & Plan Note (Signed)
No recurrent anginal type symptoms since his multivessel PCI.

## 2015-07-30 NOTE — Assessment & Plan Note (Signed)
Need to determine whether this is related to her atorvastatin or not. Although we are checking his lipids. I want him to stop Lipitor after getting lipids checked. He will stop it for a month and then reassess his symptoms of it. We will recheck with him in about a month. We may need to consider converting to rosuvastatin.

## 2015-07-30 NOTE — Assessment & Plan Note (Signed)
Stable. No anginal symptoms. No longer on an antiplatelet agent beyond aspirin. Plan: Continue beta blocker, ACE inhibitor, aspirin and statin.

## 2015-07-30 NOTE — Assessment & Plan Note (Signed)
Steadily put on about 10 pounds from his nadir of 246 pounds. -- He is now just that is been less strict himself and he had been. "Time to refocus."

## 2015-08-09 ENCOUNTER — Emergency Department (HOSPITAL_COMMUNITY): Payer: BLUE CROSS/BLUE SHIELD

## 2015-08-09 ENCOUNTER — Encounter (HOSPITAL_COMMUNITY): Payer: Self-pay | Admitting: Family Medicine

## 2015-08-09 ENCOUNTER — Telehealth: Payer: Self-pay | Admitting: Cardiology

## 2015-08-09 ENCOUNTER — Emergency Department (HOSPITAL_COMMUNITY)
Admission: EM | Admit: 2015-08-09 | Discharge: 2015-08-10 | Disposition: A | Discharge status: Home / Self Care | Payer: BLUE CROSS/BLUE SHIELD | Attending: Emergency Medicine | Admitting: Emergency Medicine

## 2015-08-09 DIAGNOSIS — I2510 Atherosclerotic heart disease of native coronary artery without angina pectoris: Secondary | ICD-10-CM | POA: Diagnosis not present

## 2015-08-09 DIAGNOSIS — Z79899 Other long term (current) drug therapy: Secondary | ICD-10-CM | POA: Insufficient documentation

## 2015-08-09 DIAGNOSIS — Z86718 Personal history of other venous thrombosis and embolism: Secondary | ICD-10-CM | POA: Insufficient documentation

## 2015-08-09 DIAGNOSIS — Z7982 Long term (current) use of aspirin: Secondary | ICD-10-CM | POA: Diagnosis not present

## 2015-08-09 DIAGNOSIS — I252 Old myocardial infarction: Secondary | ICD-10-CM | POA: Diagnosis not present

## 2015-08-09 DIAGNOSIS — R079 Chest pain, unspecified: Secondary | ICD-10-CM | POA: Insufficient documentation

## 2015-08-09 DIAGNOSIS — E785 Hyperlipidemia, unspecified: Secondary | ICD-10-CM | POA: Insufficient documentation

## 2015-08-09 DIAGNOSIS — E119 Type 2 diabetes mellitus without complications: Secondary | ICD-10-CM | POA: Diagnosis not present

## 2015-08-09 DIAGNOSIS — R61 Generalized hyperhidrosis: Secondary | ICD-10-CM | POA: Insufficient documentation

## 2015-08-09 DIAGNOSIS — E669 Obesity, unspecified: Secondary | ICD-10-CM | POA: Insufficient documentation

## 2015-08-09 DIAGNOSIS — Z9861 Coronary angioplasty status: Secondary | ICD-10-CM | POA: Insufficient documentation

## 2015-08-09 DIAGNOSIS — M546 Pain in thoracic spine: Secondary | ICD-10-CM | POA: Diagnosis not present

## 2015-08-09 DIAGNOSIS — I10 Essential (primary) hypertension: Secondary | ICD-10-CM | POA: Diagnosis not present

## 2015-08-09 DIAGNOSIS — Z86711 Personal history of pulmonary embolism: Secondary | ICD-10-CM | POA: Insufficient documentation

## 2015-08-09 LAB — I-STAT TROPONIN, ED
TROPONIN I, POC: 0 ng/mL (ref 0.00–0.08)
Troponin i, poc: 0 ng/mL (ref 0.00–0.08)

## 2015-08-09 LAB — CBC
HCT: 40.3 % (ref 39.0–52.0)
HEMOGLOBIN: 13.7 g/dL (ref 13.0–17.0)
MCH: 29.1 pg (ref 26.0–34.0)
MCHC: 34 g/dL (ref 30.0–36.0)
MCV: 85.6 fL (ref 78.0–100.0)
PLATELETS: 291 10*3/uL (ref 150–400)
RBC: 4.71 MIL/uL (ref 4.22–5.81)
RDW: 12.6 % (ref 11.5–15.5)
WBC: 6 10*3/uL (ref 4.0–10.5)

## 2015-08-09 LAB — BASIC METABOLIC PANEL
Anion gap: 9 (ref 5–15)
BUN: 13 mg/dL (ref 6–20)
CALCIUM: 9.4 mg/dL (ref 8.9–10.3)
CO2: 23 mmol/L (ref 22–32)
Chloride: 104 mmol/L (ref 101–111)
Creatinine, Ser: 0.94 mg/dL (ref 0.61–1.24)
GFR calc Af Amer: >60 mL/min (ref >60)
GLUCOSE: 262 mg/dL — AB (ref 65–99)
Potassium: 4.3 mmol/L (ref 3.5–5.1)
Sodium: 136 mmol/L (ref 135–145)

## 2015-08-09 LAB — D-DIMER, QUANTITATIVE: D-Dimer, Quant: <0.27 ug/mL-FEU (ref 0.00–0.50)

## 2015-08-09 MED ORDER — ASPIRIN 81 MG PO CHEW
324.0000 mg | CHEWABLE_TABLET | Freq: Once | ORAL | Status: AC
  Administered 2015-08-09: 324 mg via ORAL
  Filled 2015-08-09: qty 4

## 2015-08-09 NOTE — Telephone Encounter (Signed)
Pt c/o of Chest Pain: STAT if CP now or developed within 24 hours  1. Are you having CP right now? Little (3-4)   2. Are you experiencing any other symptoms (ex. SOB, nausea, vomiting, sweating)? Sweating   3. How long have you been experiencing CP? This am   4. Is your CP continuous or coming and going? Comes and goes   5. Have you taken Nitroglycerin? no ?

## 2015-08-09 NOTE — Telephone Encounter (Signed)
Pt calling from work. He notes strong/throbbing chest pain which started this AM. He does not have nitro on-hand.  Notes ongoing pain, 3-4 out of 10, occasionally shooting up to 6 or 7. Radiating. He denies SOB.  I strongly encouraged pt to go to closest ER. Pt asked if urgent care appropriate and I gave him rationale for being seen in the ER. Pt voiced understanding. He states he will seek evaluation.

## 2015-08-09 NOTE — Discharge Instructions (Signed)
You were seen in the emergency room today for evaluation of chest pain. Your workup today was negative. Please call Dr. Allison Quarry office to schedule a follow up appointment as soon as possible. Return to the ER for new or worsening symptoms.

## 2015-08-09 NOTE — ED Provider Notes (Signed)
CSN: [id number]     Arrival date & time 08/09/15  1311 History   First MD Initiated Contact with Patient 08/09/15 2035     Chief Complaint  Patient presents with  . Chest Pain     HPI  Mr. Cdebaca is an 60 y.o. 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 He states he was in his usual state of health until ~10AM this morning when he was sitting at home, drinking coffee when he started experiencing left sided heavy chest pain. He reports associated diaphoresis. He states that since then the pain has waxed and waned, and was constant for a few hours, now down to 1/10. States he did not take any medications to alleviate his symptoms. He states that a couple hours after the chest pain started he began getting a sharp stabbing pain in his upper back between his shoulder blades. He states the pain also waxed and waned in intensity, was also associated with diaphoresis. States the pain between his shoulder blades is now 4/10. Denies SOB. Denies LE edema. Denies weakness, numbness, tingling, blurred vision, headache. Pt saw Dr. Ellyn Hack in clinic earlier this month for annual f/u with unremarkable findings. He states he has not had a stress test, echo, or any other cardiac procedure since 2012.   Past Medical History  Diagnosis Date  . NSTEMI (non-ST elevated myocardial infarction) ([place]) 10/2010    multivessel PCI  . CAD (coronary artery disease) 10/2010    multivessel PCI RCA; OM1; LAD;diag  . Presence of drug coated stent in anterior descending branch of left coronary artery June 2012    Resolute DES stents: LAD- 2.75 mm x 14 mm -- 3.3 mm; Diag - 2.25 mm x 12 mm -- 2.3 mm  . Presence of drug coated stent in left circumflex coronary artery June 2012    OM1 -- 2 overlapping Resolute DES Stents: 2.25 mm 36mm, x 2m  . Presence of drug coated stent in right coronary artery June  2012    Mid RCA 2.75 mm x 22 mm  . H/O echocardiogram     EF 0000000; grade 1 diastolic dysfunction; trivial TR  . Hypertension   . Diabetes mellitus type 2, controlled ([facility] [facility])     No longer on Medcations after weight loss.  . Obesity (BMI 30.0-34.9)     Lost  > 80 lb since NSTEMI  . Hyperlipidemia LDL goal < 70    Past Surgical History  Procedure Laterality Date  . Coronary stent placement  11/02/10    PCI to mid LAD with integrity resolute DES; PCI to proximal second diagonal branch with resolute integrity  . Coronary angioplasty with stent placement  11/03/2010    PCI of mid RCA and proximal OM 1 with Resolute DESs  . Orif femur fracture Right    Family History  Problem Relation Age of Onset  . Cancer Mother   . Cancer Father   . Cancer Sister   . Heart attack Maternal Grandfather    Social History  Substance Use Topics  . Smoking status: Never Smoker   . Smokeless tobacco: None  . Alcohol Use: No    Review of Systems  All other systems reviewed and are negative.     Allergies  Bee venom  Home Medications   Prior to Admission medications   Medication Sig Start  Date End Date Taking? Authorizing Provider  aspirin 325 MG EC tablet Take 325 mg by mouth daily.    Historical Provider, MD  atorvastatin (LIPITOR) 40 MG tablet Take 1 tablet (40 mg total) by mouth daily. 09/23/14   Leonie Man, MD  lisinopril (PRINIVIL,ZESTRIL) 10 MG tablet TAKE ONE TABLET BY MOUTH ONCE DAILY 07/15/15   Leonie Man, MD  metoprolol tartrate (LOPRESSOR) 25 MG tablet Take 1 tablet (25 mg total) by mouth 2 (two) times daily. 07/28/15   Leonie Man, MD  omega-3 acid ethyl esters (LOVAZA) 1 g capsule TAKE TWO CAPSULES BY MOUTH TWICE DAILY 07/15/15   Leonie Man, MD   BP 113/68 mmHg  Pulse 53  Temp(Src) 98.3 F (36.8 C) (Oral)  Resp 18  SpO2 100% Physical Exam  Constitutional: He is oriented to person, place, and time. No distress.  Obese, NAD  HENT:  Right Ear: External ear  normal.  Left Ear: External ear normal.  Nose: Nose normal.  Mouth/Throat: Oropharynx is clear and moist. No oropharyngeal exudate.  Eyes: Conjunctivae and EOM are normal. Pupils are equal, round, and reactive to light.  Neck: Normal range of motion. Neck supple.  Cardiovascular: Normal rate, regular rhythm, normal heart sounds and intact distal pulses.   Pulmonary/Chest: Effort normal and breath sounds normal. No respiratory distress. He has no wheezes. He exhibits no tenderness.  Abdominal: Soft. Bowel sounds are normal. He exhibits no distension. There is no tenderness.  Musculoskeletal: He exhibits no edema.  Neurological: He is alert and oriented to person, place, and time. No cranial nerve deficit.  Skin: Skin is warm and dry. He is not diaphoretic.  Psychiatric: He has a normal mood and affect.  Nursing note and vitals reviewed.  Filed Vitals:   08/09/15 1324 08/09/15 1724 08/09/15 2052 08/09/15 2115  BP: 149/69 113/68 157/73 139/68  Pulse: 59 53 57 63  Temp: 98.3 F (36.8 C)     TempSrc: Oral     Resp: 18 18  14   SpO2: 97% 100% 100% 99%     ED Course  Procedures (including critical care time) Labs Review Labs Reviewed  BASIC METABOLIC PANEL - Abnormal; Notable for the following:    Glucose, Bld 262 (*)    All other components within normal limits  CBC  D-DIMER, QUANTITATIVE (NOT AT [facility])  I-STAT TROPOININ, ED  Randolm Idol, ED    Imaging Review Dg Chest 2 View  08/09/2015  CLINICAL DATA:  Chest pain for 1 day EXAM: CHEST  2 VIEW COMPARISON:  10/30/2010 FINDINGS: The heart size and mediastinal contours are within normal limits. Both lungs are clear. The visualized skeletal structures are unremarkable. IMPRESSION: No active cardiopulmonary disease. Electronically Signed   By: Inez Catalina M.D.   On: 08/09/2015 13:59   I have personally reviewed and evaluated these images and lab results as part of my medical decision-making.   EKG Interpretation   Date/Time:   Tuesday August 09 2015 13:18:26 EDT Ventricular Rate:  51 PR Interval:  156 QRS Duration: 88 QT Interval:  416 QTC Calculation: 383 R Axis:   52 Text Interpretation:  Sinus bradycardia Low voltage QRS Cannot rule out  Anterior infarct , age undetermined Abnormal ECG Sinus bradycardia Low  voltage QRS T wave abnormality Abnormal ekg Confirmed by Carmin Muskrat   MD [phone]) on 08/09/2015 9:03:35 PM      MDM   Final diagnoses:  Chest pain, unspecified chest pain type    Initial labs  in triage unremarkable. CXR negative. EKG with some t-wave abnormality. Pt does not want meds for his pain at this time. 324 ASA given in the ED. Delta trop is pending. However, HEART score is 5. I am also concerned about possible aortic dissection. PE is also on the differential. Pt does not want a CT scan tonight due to cost. Does not want admission. I discussed this with attending MD Vanita Panda. We will d-dimer pt. If negative, will hold off on scan.   D-dimer and delta trop are negative. Findings are reassuring. Pt appreciative. Instructed close cards and PCP f/u as an outpatient. Strict ER return precautions given. Pt verbalized agreement and understanding.    Anne Ng, PA-C 08/09/15 2343  Carmin Muskrat, MD 08/09/15 2350

## 2015-08-09 NOTE — ED Notes (Signed)
Pt here for chest pain that started this am. sts he woke up with it in the center of his chest and then radiating into back. sts pain gets better and worse.

## 2015-08-10 ENCOUNTER — Telehealth: Payer: Self-pay | Admitting: Cardiology

## 2015-08-10 DIAGNOSIS — E669 Obesity, unspecified: Secondary | ICD-10-CM

## 2015-08-10 DIAGNOSIS — Z79899 Other long term (current) drug therapy: Secondary | ICD-10-CM

## 2015-08-10 DIAGNOSIS — E785 Hyperlipidemia, unspecified: Secondary | ICD-10-CM

## 2015-08-10 NOTE — Telephone Encounter (Signed)
SPOKE TO PATIENT  HE STATES HE WENT ER ,  YESTERDAY FOR CHEST PAIN- RECOMMEND TO CONTACT CARDIOLOGIST SCHDEULE APPOINTMENT FOR 08/24/14 W/DR HARDING.  PATIENT ALSO WANTED TO KNOW IF LABS ARE STILL NEEDED AND A BLOOD PRESSURE CHECK   RN INFORMED PATIENT LABS - LIPID ,HEPATIC LEVEL IS NEEDED BUT BLOOD PRESURE CHECK IS NOT NEEDED PATIENT VERBALIZED UNDERSTANDING.

## 2015-08-10 NOTE — Telephone Encounter (Signed)
Left message to call back  

## 2015-08-10 NOTE — Telephone Encounter (Signed)
New message   Pt wants rn to call him

## 2015-08-10 NOTE — ED Notes (Signed)
Pt departed in NAD.  

## 2015-08-12 ENCOUNTER — Telehealth: Payer: Self-pay | Admitting: Cardiology

## 2015-08-12 LAB — HEPATIC FUNCTION PANEL
ALT: 30 U/L (ref 9–46)
AST: 19 U/L (ref 10–35)
Albumin: 4.2 g/dL (ref 3.6–5.1)
Alkaline Phosphatase: 70 U/L (ref 40–115)
BILIRUBIN DIRECT: 0.1 mg/dL (ref <=0.2)
BILIRUBIN INDIRECT: 0.3 mg/dL (ref 0.2–1.2)
TOTAL PROTEIN: 6.9 g/dL (ref 6.1–8.1)
Total Bilirubin: 0.4 mg/dL (ref 0.2–1.2)

## 2015-08-12 LAB — LIPID PANEL
CHOL/HDL RATIO: 5 ratio (ref <=5.0)
CHOLESTEROL: 174 mg/dL (ref 125–200)
HDL: 35 mg/dL — ABNORMAL LOW (ref >=40)
LDL CALC: 102 mg/dL (ref <130)
Triglycerides: 183 mg/dL — ABNORMAL HIGH (ref <150)
VLDL: 37 mg/dL — AB (ref <30)

## 2015-08-12 NOTE — Telephone Encounter (Signed)
Tish needs a verbal order because the pt is currently in the lab.

## 2015-08-12 NOTE — Telephone Encounter (Signed)
PATIENT NEEDS LIPID AND HEPATIC PANELS DONE. VERBALIZED UNDERSTANDING.

## 2015-08-15 ENCOUNTER — Other Ambulatory Visit: Payer: Self-pay | Admitting: Cardiology

## 2015-08-23 DIAGNOSIS — R079 Chest pain, unspecified: Secondary | ICD-10-CM | POA: Insufficient documentation

## 2015-08-23 NOTE — Progress Notes (Signed)
PCP: Thressa Sheller, MD  Clinic Note: Chief Complaint  Patient presents with  . Hospitalization Follow-up  . Chest Pain    pressure, tighting  . Dizziness    bending over and coming back up    HPI: Gregory Gregory is a 60 y.o. male with a PMH below who presents today for annual follow-up of CAD with multivessel PCI.Marland Kitchen He had non-STEMI in 2012 and was found to have multivessel disease -- he opted for multivessel PCI or CABG: Had PCI the RCA OM1 and LAD as well as diagonal branch. Normal EF. He has diet controlled diabetes type 2 as well as hypertension, hyperlipidemia and obesity.  Rafiel Doster was last seen on 07/28/15 - noted occasional DOE & LE Edema with standing.Following recommendations:  Increase metoprolol to 25 mg twice a day  Check labs in 2 weeks.  Stop Lipitor for one month after labs done. --> Check back in with Trixie Dredge, RN after a month to determine change in memory  Recent Hospitalizations:  - ER visit on 3/28 with CP - r/o MI  Studies Reviewed: None  Interval History: Gregory Gregory presents today for follow-up from a hospital visit for chest pain. Basically he was in his usual state of health until March 28 when he really had an episode of resting chest tightness/ pressure on that went up into his left shoulder. It was sharp. He does note this was after a long 7 day week he is very tired. The episode that lasted several hours, he is just in more tired than usual. He he has not really had any further episodes. For about 2 days he had the sensation of weakness was of palpitations off and on in the discomfort, came and went intermittently for a few days. He went to emergency room and had a negative evaluation. EKG was relatively normal as was the troponin levels.  Notably, over the last 3-4 days, he has not had any further symptoms and has been as active as usual.  Otherwise, as far as cardiac symptoms go he is relatively asymptomatic and is able to do whatever he  wants to without any symptoms. He feels like his strength was completely restored following his multivessel PCI but never really got back to a full level of stamina. He does note some dizziness worse with bending over. That is also somewhat improved.  Cardiac review of symptoms as follows:  No shortness of breath with rest or exertion.   No PND, orthopnea or edema. No palpitations, lightheadedness, weakness or syncope/near syncope.  No TIA/amaurosis fugax symptoms.  No melena, hematochezia, hematuria, or epstaxis.  No claudication.  ROS: A comprehensive was performed. Review of Systems  Constitutional: Negative for weight loss and malaise/fatigue.  HENT: Negative for nosebleeds.   Respiratory: Negative for cough, shortness of breath and wheezing.   Cardiovascular: Negative.        Per history of present illness  Genitourinary: Negative.   Musculoskeletal: Positive for joint pain (Usual arthritis type pains).  Neurological: Negative.  Negative for dizziness, sensory change, speech change, focal weakness, seizures, loss of consciousness and headaches.  Endo/Heme/Allergies: Bruises/bleeds easily.  Psychiatric/Behavioral: Negative.   All other systems reviewed and are negative.    Past Medical History  Diagnosis Date  . NSTEMI (non-ST elevated myocardial infarction) ([place]) 10/2010    multivessel PCI  . CAD (coronary artery disease) 10/2010    multivessel PCI RCA; OM1; LAD;diag  . Presence of drug coated stent in anterior descending branch of left coronary artery  June 2012    Resolute DES stents: LAD- 2.75 mm x 14 mm -- 3.3 mm; Diag - 2.25 mm x 12 mm -- 2.3 mm  . Presence of drug coated stent in left circumflex coronary artery June 2012    OM1 -- 2 overlapping Resolute DES Stents: 2.25 mm 85mm, x 32m  . Presence of drug coated stent in right coronary artery June 2012    Mid RCA 2.75 mm x 22 mm  . H/O echocardiogram     EF 0000000; grade 1 diastolic dysfunction; trivial TR  .  Hypertension   . Diabetes mellitus type 2, controlled ([place])     No longer on Medcations after weight loss.  . Obesity (BMI 30.0-34.9)     Lost  > 80 lb since NSTEMI  . Hyperlipidemia LDL goal < 70     Past Surgical History  Procedure Laterality Date  . Coronary stent placement  11/02/10    PCI to mid LAD with integrity resolute DES; PCI to proximal second diagonal branch with resolute integrity  . Coronary angioplasty with stent placement  11/03/2010    PCI of mid RCA and proximal OM 1 with Resolute DESs  . Orif femur fracture Right    Prior to Admission medications   Medication Sig Start Date End Date Taking? Authorizing Provider  aspirin 325 MG EC tablet Take 325 mg by mouth daily.   Yes Historical Provider, MD  atorvastatin (LIPITOR) 40 MG tablet Take 1 tablet (40 mg total) by mouth daily. 09/23/14  Yes Leonie Man, MD  lisinopril (PRINIVIL,ZESTRIL) 10 MG tablet TAKE ONE TABLET BY MOUTH ONCE DAILY 07/15/15  Yes Leonie Man, MD  metoprolol tartrate (LOPRESSOR) 25 MG tablet TAKE ONE-HALF TABLET BY MOUTH TWICE DAILY 07/15/15  Yes Leonie Man, MD  omega-3 acid ethyl esters (LOVAZA) 1 g capsule TAKE TWO CAPSULES BY MOUTH TWICE DAILY 07/15/15  Yes Leonie Man, MD    Allergies  Allergen Reactions  . Bee Venom     Social History   Social History  . Marital Status: Single    Spouse Name: N/A  . Number of Children: N/A  . Years of Education: N/A   Social History Main Topics  . Smoking status: Never Smoker   . Smokeless tobacco: None  . Alcohol Use: No  . Drug Use: No  . Sexual Activity: Not Asked   Other Topics Concern  . None   Social History Narrative   He is a divorced father of 49, grandfather 1. He now has a new long-term life partner, usually comes with him to appointments the ED his exercise routinely walking at least 2-3 miles a day and this case to 4 days a week. He also does him some strength and conditioning exercises as well.   He never was a smoker. He  does not drink alcohol.   family history includes Cancer in his father, mother, and sister; Heart attack in his maternal grandfather.  Wt Readings from Last 3 Encounters:  08/24/15 263 lb 3.2 oz (119.387 kg)  07/28/15 257 lb (116.574 kg)  07/13/14 254 lb 1.6 oz (115.259 kg)    PHYSICAL EXAM BP 130/66 mmHg  Pulse 52  Ht 5\' 11"  (1.803 m)  Wt 263 lb 3.2 oz (119.387 kg)  BMI 36.73 kg/m2 General: Healthy-appearing gentleman. He continues to be in a very good mood,  HEENT: NCAT. EOMI. MMM. Anicteric sclerae.  Neck: Supple. No LAN, JVD, or carotid bruit. Heart: RRR. Normal S1, S2.  No M/R/G. Nondisplaced PMI Lungs: CTAB. Nonlabored. Normal effort. Good air movement. No wheezes, rales, or rhonchi.  Abdomen: Mild truncal obesity but otherwise soft/NT/ND/NABS. No HSM.  Extremities: No C/C/E. 2+equal pulses throughout. Neuro: A and O 3. Normal mood and affect. Cranial nerves grossly intact. Nonfocal    Adult ECG Report Not checked   Other studies Reviewed: Additional studies/ records that were reviewed today include:  Recent Labs:   Lab Results  Component Value Date   CHOL 174 08/10/2015   HDL 35* 08/10/2015   LDLCALC 102 08/10/2015   TRIG 183* 08/10/2015   CHOLHDL 5.0 08/10/2015   Lab Results  Component Value Date   CREATININE 0.94 08/09/2015   Lab Results  Component Value Date   K 4.3 08/09/2015    ASSESSMENT / PLAN: Problem List Items Addressed This Visit    Obesity (BMI 30.0-34.9) (Chronic)    I think he is pretty much down to about his dry weight. Monitor for couple days. Needs to be able to get exercise and lose weight.      Mild essential hypertension (Chronic)    Blood pressure was good today here on current meds.      Hyperlipidemia with target LDL less than 70 (Chronic)   History of: Non-ST elevation MI (NSTEMI) ([place]) (Chronic)    Despite having some mild reduced EF, not really having a lot of significant CHF symptoms and no obvious anginal symptoms. On  statin beta blocker and ACE inhibitor as well as aspirin. Plavix had been previously stopped      Chest pain with moderate risk for cardiac etiology    This is really maybe a second time since his multivessel PCI that he has had some chest discomfort. Last time a few years now. Typical features are the description of the tightness and pressure associated with some dyspnea and dizziness. Atypical features that it was at rest and there were some palpitations associated with it. Given his history of multivessel PCI, think is reasonable to proceed with Myoview stress test. He is on relatively low-dose beta blocker, and I would actually like to see with the symptoms are with the beta blocker on board.      Relevant Orders   EKG 12-Lead   Myocardial Perfusion Imaging   CAD S/P percutaneous coronary angioplasty - Primary (Chronic)    Status post multivessel PCI. Had been stable up until recent hospital/ER visit. On aspirin and statin beta blocker and ACE inhibitor. Following up chest discomfort with Myoview stress test. We will act according to the results.      Relevant Orders   EKG 12-Lead      Current medicines are reviewed at length with the patient today. (+/- concerns) None The following changes have been made:   Increase metoprolol to 25 mg twice a day  Check labs in 2 weeks.  Stop Lipitor for one month after labs done. --> Check back in with Trixie Dredge, RN after a month to determine change in memory   Studies Ordered:   Orders Placed This Encounter  Procedures  . Myocardial Perfusion Imaging  . EKG 12-Lead   Follow-up in one year     Naylee Frankowski, Leonie Green, M.D., M.S. Interventional Cardiologist   Pager # [phone] Phone # [phone] [address] [place], [place] [address]

## 2015-08-24 ENCOUNTER — Encounter: Payer: Self-pay | Admitting: Cardiology

## 2015-08-24 ENCOUNTER — Ambulatory Visit (INDEPENDENT_AMBULATORY_CARE_PROVIDER_SITE_OTHER): Payer: BLUE CROSS/BLUE SHIELD | Admitting: Cardiology

## 2015-08-24 VITALS — BP 130/66 | HR 52 | Ht 71.0 in | Wt 263.2 lb

## 2015-08-24 DIAGNOSIS — I214 Non-ST elevation (NSTEMI) myocardial infarction: Secondary | ICD-10-CM

## 2015-08-24 DIAGNOSIS — R079 Chest pain, unspecified: Secondary | ICD-10-CM

## 2015-08-24 DIAGNOSIS — E785 Hyperlipidemia, unspecified: Secondary | ICD-10-CM

## 2015-08-24 DIAGNOSIS — E669 Obesity, unspecified: Secondary | ICD-10-CM

## 2015-08-24 DIAGNOSIS — I2510 Atherosclerotic heart disease of native coronary artery without angina pectoris: Secondary | ICD-10-CM | POA: Diagnosis not present

## 2015-08-24 DIAGNOSIS — I10 Essential (primary) hypertension: Secondary | ICD-10-CM | POA: Diagnosis not present

## 2015-08-24 DIAGNOSIS — Z9861 Coronary angioplasty status: Secondary | ICD-10-CM

## 2015-08-24 NOTE — Assessment & Plan Note (Signed)
Status post multivessel PCI. Had been stable up until recent hospital/ER visit. On aspirin and statin beta blocker and ACE inhibitor. Following up chest discomfort with Myoview stress test. We will act according to the results.

## 2015-08-24 NOTE — Patient Instructions (Signed)
Medication Instructions:  Your physician recommends that you continue on your current medications as directed. Please refer to the Current Medication list given to you today.  Labwork: none  Testing/Procedures: Your physician has requested that you have en exercise stress myoview. For further information please visit [email or web address]. Please follow instruction sheet, as given. Ok to schedule in about a month but if you have any problems before then call to reschedule sooner  Follow-Up: Your physician recommends that you schedule a follow-up appointment in: 1 month after myoview   If you need a refill on your cardiac medications before your next appointment, please call your pharmacy.

## 2015-08-24 NOTE — Assessment & Plan Note (Signed)
Despite having some mild reduced EF, not really having a lot of significant CHF symptoms and no obvious anginal symptoms. On statin beta blocker and ACE inhibitor as well as aspirin. Plavix had been previously stopped

## 2015-08-24 NOTE — Assessment & Plan Note (Signed)
Blood pressure was good today here on current meds.

## 2015-08-24 NOTE — Assessment & Plan Note (Signed)
This is really maybe a second time since his multivessel PCI that he has had some chest discomfort. Last time a few years now. Typical features are the description of the tightness and pressure associated with some dyspnea and dizziness. Atypical features that it was at rest and there were some palpitations associated with it. Given his history of multivessel PCI, think is reasonable to proceed with Myoview stress test. He is on relatively low-dose beta blocker, and I would actually like to see with the symptoms are with the beta blocker on board.

## 2015-08-24 NOTE — Assessment & Plan Note (Signed)
I think he is pretty much down to about his dry weight. Monitor for couple days. Needs to be able to get exercise and lose weight.

## 2015-09-16 ENCOUNTER — Other Ambulatory Visit: Payer: Self-pay | Admitting: Cardiology

## 2015-09-19 ENCOUNTER — Other Ambulatory Visit: Payer: Self-pay

## 2015-09-19 MED ORDER — METOPROLOL TARTRATE 25 MG PO TABS: 25.0000 mg | ORAL_TABLET | Freq: Two times a day (BID) | ORAL | Status: DC

## 2015-09-19 NOTE — Telephone Encounter (Signed)
Rx Refill

## 2015-09-21 ENCOUNTER — Telehealth (HOSPITAL_COMMUNITY): Payer: Self-pay

## 2015-09-21 NOTE — Telephone Encounter (Signed)
Encounter complete. 

## 2015-09-23 ENCOUNTER — Ambulatory Visit (HOSPITAL_COMMUNITY)
Admission: RE | Admit: 2015-09-23 | Discharge: 2015-09-23 | Disposition: A | Discharge status: Home / Self Care | Payer: BLUE CROSS/BLUE SHIELD | Source: Ambulatory Visit | Attending: Internal Medicine | Admitting: Internal Medicine

## 2015-09-23 DIAGNOSIS — E119 Type 2 diabetes mellitus without complications: Secondary | ICD-10-CM | POA: Diagnosis not present

## 2015-09-23 DIAGNOSIS — Z8249 Family history of ischemic heart disease and other diseases of the circulatory system: Secondary | ICD-10-CM | POA: Diagnosis not present

## 2015-09-23 DIAGNOSIS — E669 Obesity, unspecified: Secondary | ICD-10-CM | POA: Diagnosis not present

## 2015-09-23 DIAGNOSIS — I10 Essential (primary) hypertension: Secondary | ICD-10-CM | POA: Diagnosis not present

## 2015-09-23 DIAGNOSIS — R0609 Other forms of dyspnea: Secondary | ICD-10-CM | POA: Diagnosis not present

## 2015-09-23 DIAGNOSIS — R079 Chest pain, unspecified: Secondary | ICD-10-CM | POA: Diagnosis not present

## 2015-09-23 DIAGNOSIS — R42 Dizziness and giddiness: Secondary | ICD-10-CM | POA: Insufficient documentation

## 2015-09-23 DIAGNOSIS — Z6836 Body mass index (BMI) 36.0-36.9, adult: Secondary | ICD-10-CM | POA: Insufficient documentation

## 2015-09-23 DIAGNOSIS — R002 Palpitations: Secondary | ICD-10-CM | POA: Insufficient documentation

## 2015-09-23 DIAGNOSIS — R5383 Other fatigue: Secondary | ICD-10-CM | POA: Insufficient documentation

## 2015-09-23 HISTORY — PX: NM MYOVIEW LTD: HXRAD82

## 2015-09-23 LAB — MYOCARDIAL PERFUSION IMAGING
CHL CUP NUCLEAR SDS: 0
CHL CUP NUCLEAR SRS: 0
CHL CUP NUCLEAR SSS: 0
CHL CUP RESTING HR STRESS: 66 {beats}/min
CSEPED: 4 min
CSEPEDS: 30 s
CSEPPHR: 169 {beats}/min
Estimated workload: 6.4 METS
LV dias vol: 98 mL (ref 62–150)
LV sys vol: 39 mL
MPHR: 162 {beats}/min
Percent HR: 104 %
RPE: 16
TID: 0.84

## 2015-09-23 MED ORDER — TECHNETIUM TC 99M SESTAMIBI GENERIC - CARDIOLITE
10.2000 | Freq: Once | INTRAVENOUS | Status: AC | PRN
  Administered 2015-09-23: 10.2 via INTRAVENOUS

## 2015-09-23 MED ORDER — TECHNETIUM TC 99M SESTAMIBI GENERIC - CARDIOLITE
30.9000 | Freq: Once | INTRAVENOUS | Status: AC | PRN
  Administered 2015-09-23: 30.9 via INTRAVENOUS

## 2015-09-30 ENCOUNTER — Telehealth: Payer: Self-pay | Admitting: *Deleted

## 2015-09-30 NOTE — Telephone Encounter (Signed)
-----   Message from Leonie Man, MD sent at 09/26/2015  6:53 AM EDT ----- Stress Test results is LOW Risk with no evidence of "perfusion defect" on images.  The EKG portion was abnormal, but the imaging trumps the EKG. Normal function.  [name]

## 2015-09-30 NOTE — Telephone Encounter (Signed)
LEFT MESSAGE TO CALL BACK-- STRESS TEST RELEASE TO MY CHART

## 2015-10-15 ENCOUNTER — Other Ambulatory Visit: Payer: Self-pay | Admitting: Cardiology

## 2015-10-17 NOTE — Telephone Encounter (Signed)
Rx(s) sent to pharmacy electronically.  

## 2015-10-24 ENCOUNTER — Encounter: Payer: Self-pay | Admitting: Cardiology

## 2015-10-24 NOTE — Progress Notes (Signed)
PCP: Thressa Sheller, MD  Clinic Note: Chief Complaint  Patient presents with  . Follow-up  . Coronary Artery Disease    HPI: Gregory Gregory is a 60 y.o. male with a PMH below who presents today for two-month follow-up of CAD-multivessel PCI. To discuss results of Myoview.. He had non-STEMI in 2012 and was found to have multivessel disease -- he opted for multivessel PCI or CABG: Had PCI the RCA OM1 and LAD as well as diagonal branch. Normal EF. He has diet controlled diabetes type 2 as well as hypertension, hyperlipidemia and obesity.  Gregory Gregory was last seen on 08/24/2015 as a hospital follow-up. He presented with chest pain that occurred at rest. It with him to his left shoulder ROM his chest. It was described as a sharp, pressure-like symptom. This is after a long 7 day work week and he was very tired. It lasted several hours. He felt a little bit weak thereafter with some palpitations on-and-off discomfort. After that, he did not have any further episodes. We ordered a Myoview stress test to confirm or deny ischemia.  Recent Hospitalizations: None since last visit  Studies Reviewed:   Myoview 09/23/2015: EF 60%. Hypertensive response to exercise.   Horizontal ST depressions of 1.5 mm noted in leads II, III, aVF, V4-V6 (this would be considered positive for ischemia).   Exercise was stopped due to fatigue and atypical chest pain as well as dyspnea. He exercised 10 minutes - DUKE Treadmill Score was MODERATE RISK  However, perfusion images showed no evidence of ischemia or infarction. LOW RISK study given normal images.   Interval History: He presents for f/u after his stress test.  No further episodes like he had.  Occasional twinges the chest that are Lived, lasting maybe one minute or so. - Not at all similar to his right ear anginal type symptoms. He just is probably more aware of the symptoms now since he had one prolonged episode. Somewhat relieved with the results of  his stress test. No chest pain or shortness of breath with rest or exertion. No PND, orthopnea or edema. No palpitations, lightheadedness, dizziness, weakness or syncope/near syncope. No TIA/amaurosis fugax symptoms.  No claudication.  His memory issues seem to have cleared up quite a bit since having start stopped his Lipitor. He is now able to complete his thoughts and not have fleeting episodes of forgetfulness.  ROS: A comprehensive was performed. Review of Systems  Constitutional: Negative for malaise/fatigue.  HENT: Negative for congestion and nosebleeds.   Eyes: Negative for blurred vision and double vision.  Respiratory: Negative for cough, shortness of breath and wheezing.   Cardiovascular:       Per history of present illness  Gastrointestinal: Negative for heartburn, abdominal pain, blood in stool and melena.  Musculoskeletal: Positive for back pain and joint pain (Usual arthritic pains).  Neurological: Negative for loss of consciousness and headaches.  Endo/Heme/Allergies: Does not bruise/bleed easily.  Psychiatric/Behavioral: Positive for memory loss (Improved now that he is off of Lipitor). Negative for depression. The patient is not nervous/anxious and does not have insomnia.   All other systems reviewed and are negative.   Past Medical History  Diagnosis Date  . NSTEMI (non-ST elevated myocardial infarction) ([place]) 10/2010    multivessel PCI  . CAD (coronary artery disease) 10/2010    multivessel PCI RCA; OM1; LAD;diag --> Myoview May 2017: Moderate Risk Duke Treadmill Score - negative perfusion images for ischemia or infarction.  . Presence of drug coated stent in  anterior descending branch of left coronary artery June 2012    Resolute DES stents: LAD- 2.75 mm x 14 mm -- 3.3 mm; Diag - 2.25 mm x 12 mm -- 2.3 mm  . Presence of drug coated stent in left circumflex coronary artery June 2012    OM1 -- 2 overlapping Resolute DES Stents: 2.25 mm 32mm, x 9m  . Presence of  drug coated stent in right coronary artery June 2012    Mid RCA 2.75 mm x 22 mm  . H/O echocardiogram     EF 0000000; grade 1 diastolic dysfunction; trivial TR  . Hypertension   . Diabetes mellitus type 2, controlled ([place])     No longer on Medcations after weight loss.  . Obesity (BMI 30.0-34.9)     Lost  > 80 lb since NSTEMI  . Hyperlipidemia LDL goal < 70     Past Surgical History  Procedure Laterality Date  . Coronary stent placement  11/02/10    PCI to mid LAD with integrity resolute DES; PCI to proximal second diagonal branch with resolute integrity  . Coronary angioplasty with stent placement  11/03/2010    PCI of mid RCA and proximal OM 1 with Resolute DESs  . Orif femur fracture Right   . Nm myoview ltd  09/23/2015    LOW RISK : Moderate Risk Duke Treadmill Score - negative perfusion images for ischemia or infarction.    Prior to Admission medications   Medication Sig Start Date End Date Taking? Authorizing Provider  atorvastatin (LIPITOR) 40 MG tablet TAKE ONE TABLET BY MOUTH ONCE DAILY 10/17/15  Yes Leonie Man, MD  lisinopril (PRINIVIL,ZESTRIL) 10 MG tablet TAKE ONE TABLET BY MOUTH ONCE DAILY 08/16/15  Yes Leonie Man, MD  metoprolol tartrate (LOPRESSOR) 25 MG tablet Take 1 tablet (25 mg total) by mouth 2 (two) times daily. 09/19/15  Yes Leonie Man, MD  omega-3 acid ethyl esters (LOVAZA) 1 g capsule TAKE TWO CAPSULES BY MOUTH TWICE DAILY 08/16/15  Yes Leonie Man, MD  - Aspirin 81 mg daily   Allergies  Allergen Reactions  . Bee Venom Anaphylaxis    Social History   Social History  . Marital Status: Single    Spouse Name: N/A  . Number of Children: N/A  . Years of Education: N/A   Social History Main Topics  . Smoking status: Never Smoker   . Smokeless tobacco: None  . Alcohol Use: No  . Drug Use: No  . Sexual Activity: Not Asked   Other Topics Concern  . None   Social History Narrative   He is a divorced father of 4, grandfather 1. He now has a  new long-term life partner, usually comes with him to appointments the ED his exercise routinely walking at least 2-3 miles a day and this case to 4 days a week. He also does him some strength and conditioning exercises as well.   He never was a smoker. He does not drink alcohol.    Family History  Problem Relation Age of Onset  . Cancer Mother   . Cancer Father   . Cancer Sister   . Heart attack Maternal Grandfather     Wt Readings from Last 3 Encounters:  10/25/15 263 lb (119.296 kg)  09/23/15 263 lb (119.296 kg)  08/24/15 263 lb 3.2 oz (119.387 kg)    PHYSICAL EXAM BP 110/74 mmHg  Pulse 56  Ht 5\' 11"  (1.803 m)  Wt 263 lb (119.296 kg)  BMI 36.70 kg/m2 General: Healthy-appearing gentleman. He continues to be in a very good mood,  HEENT: NCAT. EOMI. MMM. Anicteric sclerae.  Neck: Supple. No LAN, JVD, or carotid bruit. Heart: RRR. Normal S1, S2. No M/R/G. Nondisplaced PMI Lungs: CTAB. Nonlabored. Normal effort. Good air movement. No wheezes, rales, or rhonchi.  Abdomen: Mild truncal obesity but otherwise soft/NT/ND/NABS. No HSM.  Extremities: No C/C/E. 2+equal pulses throughout. Neuro: A and O 3. Normal mood and affect. Cranial nerves grossly intact. Nonfocal    Adult ECG Report  Rate: 56 ;  Rhythm: sinus bradycardia and Low voltage with nonspecific ST-T-wave changes noted in lead III and aVF.;   Narrative Interpretation: Stable EKG   Other studies Reviewed: Additional studies/ records that were reviewed today include:  Recent Labs:   Lab Results  Component Value Date   CHOL 174 08/10/2015   HDL 35* 08/10/2015   LDLCALC 102 08/10/2015   TRIG 183* 08/10/2015   CHOLHDL 5.0 08/10/2015     ASSESSMENT / PLAN: Problem List Items Addressed This Visit    Obesity (BMI 30.0-34.9) (Chronic)    The patient understands the need to lose weight with diet and exercise. We have discussed specific strategies for this.      Relevant Orders   EKG 12-Lead   Lipid panel    Comprehensive metabolic panel   Mild essential hypertension - Primary (Chronic)    Stable blood pressures on current dose of ACE inhibitor beta blocker.      Relevant Medications   rosuvastatin (CRESTOR) 20 MG tablet   Other Relevant Orders   EKG 12-Lead   Lipid panel   Comprehensive metabolic panel   Hyperlipidemia with target LDL less than 70 (Chronic)    Unfortunately, his labs show LDL is outside of goal. His triglycerides are a bit elevated at HDL is a little bit low. We have stopped his Lipitor for memory issues. At this point with continued weight loss and diet, but I think we need to restart a statin. I will start him at moderate dose of Crestor which he will take every other day for the first month and then try to increase to once a day. He will reassess his neurologic symptoms after about 2 months. If he does start having episodes with memory again then we'll need to stop that medicine altogether. I can then consider pravastatin versus Livalo.      Relevant Medications   rosuvastatin (CRESTOR) 20 MG tablet   Other Relevant Orders   EKG 12-Lead   Lipid panel   Comprehensive metabolic panel   Chest pain with moderate risk for cardiac etiology    Reassuring results on stress test. He had some gut attenuation artifact which was not commented on. This makes this bloody less than perfect however. The EKG portion was definitely abnormal and probably by Duke treadmill score was more moderate than high as indicated by the stress test reader.  He still having some symptoms now that to me sound pretty atypical and not like his angina. I'll see him back in close follow-up just to make sure that he is doing okay. But, we may have a low threshold to consider cardiac catheterization if his symptoms recur.      Relevant Orders   EKG 12-Lead   Lipid panel   Comprehensive metabolic panel   CAD S/P percutaneous coronary angioplasty (Chronic)    Status post multivessel PCI.Marland Kitchen With the  exception of the recent episode of chest discomfort, he had been stable on  his current regimen. Continue low-dose beta blocker which is most can tolerate. He is also on moderate dose of ACE inhibitor. Restarting statin with Crestor. Continue Lovaza. It doesn't have listed aspirin, he is on aspirin 81 mg.      Relevant Medications   rosuvastatin (CRESTOR) 20 MG tablet   Other Relevant Orders   EKG 12-Lead   Lipid panel   Comprehensive metabolic panel      Current medicines are reviewed at length with the patient today. (+/- concerns) asked about ? Statins. The following changes have been made:   START GENERIC CRESTOR 20 MG [place], THEN GO TO ONCE A DAY.  NO OTHER CHANGES WITH MEDICATIONS  LABS IN 6 MONTH - WILL MAIL YOU THE LAB SLIP CMP ,LIPID  Your physician wants you to follow-up in [place] DR Cletus [place].  Studies Ordered:   Orders Placed This Encounter  Procedures  . Lipid panel  . Comprehensive metabolic panel  . EKG 12-Lead      [name], M.D., M.S. Interventional Cardiologist   Pager # [phone] Phone # [phone] [address] [place], [place] [address]

## 2015-10-25 ENCOUNTER — Ambulatory Visit (INDEPENDENT_AMBULATORY_CARE_PROVIDER_SITE_OTHER): Payer: BLUE CROSS/BLUE SHIELD | Admitting: Cardiology

## 2015-10-25 ENCOUNTER — Encounter: Payer: Self-pay | Admitting: Cardiology

## 2015-10-25 VITALS — BP 110/74 | HR 56 | Ht 71.0 in | Wt 263.0 lb

## 2015-10-25 DIAGNOSIS — Z9861 Coronary angioplasty status: Secondary | ICD-10-CM

## 2015-10-25 DIAGNOSIS — I10 Essential (primary) hypertension: Secondary | ICD-10-CM | POA: Diagnosis not present

## 2015-10-25 DIAGNOSIS — E785 Hyperlipidemia, unspecified: Secondary | ICD-10-CM | POA: Diagnosis not present

## 2015-10-25 DIAGNOSIS — R079 Chest pain, unspecified: Secondary | ICD-10-CM | POA: Diagnosis not present

## 2015-10-25 DIAGNOSIS — E669 Obesity, unspecified: Secondary | ICD-10-CM

## 2015-10-25 DIAGNOSIS — I2510 Atherosclerotic heart disease of native coronary artery without angina pectoris: Secondary | ICD-10-CM | POA: Diagnosis not present

## 2015-10-25 MED ORDER — ROSUVASTATIN CALCIUM 20 MG PO TABS: 20.0000 mg | ORAL_TABLET | Freq: Every day | ORAL | Status: DC

## 2015-10-25 NOTE — Patient Instructions (Signed)
LABS IN 6 MONTH - WILL MAIL YOU THE LAB SLIP CMP ,LIPID  START GENERIC CRESTOR 20 MG FOR THE FIRST MONTH TAKE EVERY OTHER DAY, THEN GO TO ONCE A DAY.  NO OTHER CHANGES WITH MEDICATIONS  Your physician wants you to follow-up in [place]. You will receive a reminder letter in the mail two months in advance. If you don't receive a letter, please call our office to schedule the follow-up appointment.   If you need a refill on your cardiac medications before your next appointment, please call your pharmacy.

## 2015-10-27 NOTE — Assessment & Plan Note (Signed)
The patient understands the need to lose weight with diet and exercise. We have discussed specific strategies for this.  

## 2015-10-27 NOTE — Assessment & Plan Note (Addendum)
Status post multivessel PCI.Marland Kitchen With the exception of the recent episode of chest discomfort, he had been stable on his current regimen. Continue low-dose beta blocker which is most can tolerate. He is also on moderate dose of ACE inhibitor. Restarting statin with Crestor. Continue Lovaza. It doesn't have listed aspirin, he is on aspirin 81 mg.

## 2015-10-27 NOTE — Assessment & Plan Note (Addendum)
Reassuring results on stress test. He had some gut attenuation artifact which was not commented on. This makes this bloody less than perfect however. The EKG portion was definitely abnormal and probably by Duke treadmill score was more moderate than high as indicated by the stress test reader.  He still having some symptoms now that to me sound pretty atypical and not like his angina. I'll see him back in close follow-up just to make sure that he is doing okay. But, we may have a low threshold to consider cardiac catheterization if his symptoms recur.

## 2015-10-27 NOTE — Assessment & Plan Note (Signed)
Unfortunately, his labs show LDL is outside of goal. His triglycerides are a bit elevated at HDL is a little bit low. We have stopped his Lipitor for memory issues. At this point with continued weight loss and diet, but I think we need to restart a statin. I will start him at moderate dose of Crestor which he will take every other day for the first month and then try to increase to once a day. He will reassess his neurologic symptoms after about 2 months. If he does start having episodes with memory again then we'll need to stop that medicine altogether. I can then consider pravastatin versus Livalo.

## 2015-10-27 NOTE — Assessment & Plan Note (Signed)
Stable blood pressures on current dose of ACE inhibitor beta blocker.

## 2016-03-08 ENCOUNTER — Emergency Department (HOSPITAL_COMMUNITY)
Admission: EM | Admit: 2016-03-08 | Discharge: 2016-03-08 | Disposition: A | Discharge status: Home / Self Care | Payer: BLUE CROSS/BLUE SHIELD | Attending: Emergency Medicine | Admitting: Emergency Medicine

## 2016-03-08 ENCOUNTER — Encounter (HOSPITAL_COMMUNITY): Payer: Self-pay

## 2016-03-08 DIAGNOSIS — Z955 Presence of coronary angioplasty implant and graft: Secondary | ICD-10-CM | POA: Insufficient documentation

## 2016-03-08 DIAGNOSIS — I82431 Acute embolism and thrombosis of right popliteal vein: Secondary | ICD-10-CM | POA: Insufficient documentation

## 2016-03-08 DIAGNOSIS — I82411 Acute embolism and thrombosis of right femoral vein: Secondary | ICD-10-CM | POA: Insufficient documentation

## 2016-03-08 DIAGNOSIS — I252 Old myocardial infarction: Secondary | ICD-10-CM | POA: Diagnosis not present

## 2016-03-08 DIAGNOSIS — I10 Essential (primary) hypertension: Secondary | ICD-10-CM | POA: Insufficient documentation

## 2016-03-08 DIAGNOSIS — I82441 Acute embolism and thrombosis of right tibial vein: Secondary | ICD-10-CM | POA: Insufficient documentation

## 2016-03-08 DIAGNOSIS — I2510 Atherosclerotic heart disease of native coronary artery without angina pectoris: Secondary | ICD-10-CM | POA: Diagnosis not present

## 2016-03-08 DIAGNOSIS — M7989 Other specified soft tissue disorders: Secondary | ICD-10-CM | POA: Diagnosis present

## 2016-03-08 DIAGNOSIS — E119 Type 2 diabetes mellitus without complications: Secondary | ICD-10-CM | POA: Diagnosis not present

## 2016-03-08 DIAGNOSIS — I82401 Acute embolism and thrombosis of unspecified deep veins of right lower extremity: Secondary | ICD-10-CM

## 2016-03-08 MED ORDER — RIVAROXABAN (XARELTO) VTE STARTER PACK (15 & 20 MG): ORAL_TABLET | ORAL | 0 refills | Status: DC

## 2016-03-08 MED ORDER — RIVAROXABAN 15 MG PO TABS
15.0000 mg | ORAL_TABLET | Freq: Once | ORAL | Status: AC
  Administered 2016-03-08: 15 mg via ORAL
  Filled 2016-03-08: qty 1

## 2016-03-08 NOTE — ED Provider Notes (Signed)
[facility] DEPT Provider Note   CSN: [id number] Arrival date & time: 03/08/16  1821  By signing my name below, I, Reola Mosher, attest that this documentation has been prepared under the direction and in the presence of Virgel Manifold, MD. Electronically Signed: Reola Mosher, ED Scribe. 03/08/16. 7:18 PM.  History   Chief Complaint Chief Complaint  Patient presents with  . DVT   The history is provided by the patient and medical records. No language interpreter was used.   HPI Comments: Gregory Gregory is a 60 y.o. male with a h/o prior DVT/PE and CAD s/p stent placement, who presents to the Emergency Department complaining of gradually worsening, constant pain, swelling, and redness to the right lower extremity onset approximately 3-4 days ago. He rates his pain as 7/10 and notes that it is more localized to the lateral portion of his left inner thigh. Pt was evaluated at [facility] prior to his arrival in the ED today where they performed US imaging to the area. At that time he was dx'd w/ a DVT to the area. His pain to the area is exacerbated with ambulation. Pt was previously on Coumadin therapy and Lovenox injections from prior PE/DVT, however, states that he has not tolerated these therapies in ~3-4 years. He denies numbness, paraesthesias, cough, SOB, or any other associated symptoms.  PCP: Thressa Sheller, MD  Cardiologist: [name], MD  Past Medical History:  Diagnosis Date  . CAD (coronary artery disease) 10/2010   multivessel PCI RCA; OM1; LAD;diag --> Myoview May 2017: Moderate Risk Duke Treadmill Score - negative perfusion images for ischemia or infarction.  . Diabetes mellitus type 2, controlled ([place])    No longer on Medcations after weight loss.  . H/O echocardiogram    EF 0000000; grade 1 diastolic dysfunction; trivial TR  . Hyperlipidemia LDL goal < 70   . Hypertension   . NSTEMI (non-ST elevated myocardial infarction) ([place]) 10/2010   multivessel  PCI  . Obesity (BMI 30.0-34.9)    Lost  > 80 lb since NSTEMI  . Presence of drug coated stent in anterior descending branch of left coronary artery June 2012   Resolute DES stents: LAD- 2.75 mm x 14 mm -- 3.3 mm; Diag - 2.25 mm x 12 mm -- 2.3 mm  . Presence of drug coated stent in left circumflex coronary artery June 2012   OM1 -- 2 overlapping Resolute DES Stents: 2.25 mm 58mm, x 19m  . Presence of drug coated stent in right coronary artery June 2012   Mid RCA 2.75 mm x 22 mm   Patient Active Problem List   Diagnosis Date Noted  . Chest pain with moderate risk for cardiac etiology 08/23/2015  . Memory loss of unknown cause 07/30/2015  . Encounter for long-term (current) use of medications 07/15/2014  . Diabetes mellitus type 2 in obese ([place]) 12/31/2012  . Hyperlipidemia with target LDL less than 70   . Obesity (BMI 30.0-34.9)   . Mild essential hypertension   . History of: Non-ST elevation MI (NSTEMI) ([place]) 10/30/2010  . CAD S/P percutaneous coronary angioplasty 10/13/2010   Past Surgical History:  Procedure Laterality Date  . CORONARY ANGIOPLASTY WITH STENT PLACEMENT  11/03/2010   PCI of mid RCA and proximal OM 1 with Resolute DESs  . CORONARY STENT PLACEMENT  11/02/10   PCI to mid LAD with integrity resolute DES; PCI to proximal second diagonal branch with resolute integrity  . NM MYOVIEW LTD  09/23/2015  LOW RISK : Moderate Risk Duke Treadmill Score - negative perfusion images for ischemia or infarction.  . ORIF FEMUR FRACTURE Right     Home Medications    Prior to Admission medications   Medication Sig Start Date End Date Taking? Authorizing Provider  atorvastatin (LIPITOR) 40 MG tablet TAKE ONE TABLET BY MOUTH ONCE DAILY 10/17/15   Leonie Man, MD  lisinopril (PRINIVIL,ZESTRIL) 10 MG tablet TAKE ONE TABLET BY MOUTH ONCE DAILY 08/16/15   Leonie Man, MD  metoprolol tartrate (LOPRESSOR) 25 MG tablet Take 1 tablet (25 mg total) by mouth 2 (two) times daily. 09/19/15   Leonie Man, MD  omega-3 acid ethyl esters (LOVAZA) 1 g capsule TAKE TWO CAPSULES BY MOUTH TWICE DAILY 08/16/15   Leonie Man, MD  rosuvastatin (CRESTOR) 20 MG tablet Take 1 tablet (20 mg total) by mouth daily. 10/25/15   Leonie Man, MD   Family History Family History  Problem Relation Age of Onset  . Cancer Mother   . Cancer Father   . Cancer Sister   . Heart attack Maternal Grandfather    Social History Social History  Substance Use Topics  . Smoking status: Never Smoker  . Smokeless tobacco: Never Used  . Alcohol use No   Allergies   Bee venom   Review of Systems Review of Systems  Respiratory: Negative for cough and shortness of breath.   Cardiovascular: Negative for chest pain.  Musculoskeletal: Positive for myalgias (RLE).  Skin: Positive for color change (RLE).  Neurological: Negative for numbness.       Negative for paraesthesias.  All other systems reviewed and are negative.  Physical Exam Updated Vital Signs BP 151/72 (BP Location: Right Arm)   Pulse 98   Temp 98 F (36.7 C) (Oral)   Resp 18   Ht 5\' 10"  (1.778 m)   Wt 268 lb (121.6 kg)   SpO2 100%   BMI 38.45 kg/m   Physical Exam  Constitutional: He appears well-developed and well-nourished.  HENT:  Head: Normocephalic.  Right Ear: External ear normal.  Left Ear: External ear normal.  Nose: Nose normal.  Eyes: Conjunctivae are normal. Right eye exhibits no discharge. Left eye exhibits no discharge.  Neck: Normal range of motion.  Cardiovascular: Normal rate, regular rhythm and normal heart sounds.   No murmur heard. Pulmonary/Chest: Effort normal and breath sounds normal. No respiratory distress. He has no wheezes. He has no rales.  Abdominal: Soft. There is no tenderness. There is no rebound and no guarding.  Musculoskeletal: Normal range of motion. He exhibits edema. He exhibits no tenderness.  Significant swellign of RLE up to the proximal thigh. Right foot is warm. Sensation intact.  Palpable DP pulses.  Neurological: He is alert. No cranial nerve deficit. Coordination normal.  Skin: Skin is warm and dry. No erythema. No pallor.  Psychiatric: He has a normal mood and affect. His behavior is normal.  Nursing note and vitals reviewed.  ED Treatments / Results  DIAGNOSTIC STUDIES: Oxygen Saturation is 100% on RA, normal by my interpretation.   COORDINATION OF CARE: 7:18 PM-Discussed next steps with pt. Pt verbalized understanding and is agreeable with the plan.   Labs (all labs ordered are listed, but only abnormal results are displayed) Labs Reviewed - No data to display  EKG  EKG Interpretation None      Radiology No results found.  Procedures Procedures   Medications Ordered in ED Medications - No data to display  Initial Impression / Assessment and Plan / ED Course  I have reviewed the triage vital signs and the nursing notes.  Pertinent labs & imaging results that were available during my care of the patient were reviewed by me and considered in my medical decision making (see chart for details).  Clinical Course   60yo male who presents into the ED w/ swelling, redness, and pain to the RLE. Pt evaluated in [place] and subsequently referred for US imaging at [facility]. Prior charts from today reveal impression of: There is extensive DVT involving the right lower extremity from the common femoral, into the femoral vein and popliteal veins into the posterior tibial veins of the calf.Will d/c on Xarelto therapy. Pt is comfortable with above plan and is stable for discharge at this time. All questions were answered prior to disposition. Strict return precautions for f/u into the ED were discussed.   Final Clinical Impressions(s) / ED Diagnoses   Final diagnoses:  Acute thromboembolism of deep veins of right lower extremity (HCC)   New Prescriptions New Prescriptions   No medications on file   I personally preformed the  services scribed in my presence. The recorded information has been reviewed is accurate. Virgel Manifold, MD.     Virgel Manifold, MD 03/12/16 [id number]

## 2016-03-08 NOTE — ED Triage Notes (Addendum)
Pt reports he was sent here after he was called and told he has a DVT. Pt has hx of DVT's. Pt also has hx of PE's. Swelling and redness noted to RLE.

## 2016-03-08 NOTE — ED Notes (Signed)
MD called me and told me that they were sending pt by POV for further evaluation of DVT which was confirmed by venous doppler at "[facility] hospital".  Pt has hx of clot in the past and had a filter placed.  No CP or sob with this.  Pt is alert and oriented.

## 2016-03-09 ENCOUNTER — Telehealth: Payer: Self-pay | Admitting: Cardiology

## 2016-03-09 DIAGNOSIS — I82411 Acute embolism and thrombosis of right femoral vein: Secondary | ICD-10-CM | POA: Insufficient documentation

## 2016-03-09 NOTE — Telephone Encounter (Signed)
Spoke with pt scheduled appt for 11-3 @8am 

## 2016-03-09 NOTE — Telephone Encounter (Signed)
New message     Pt went to [facility] on 10-23 c/o pulled muscle in back.  Pt was put on a muscle relaxant.  On thurs---oct 26, pt c/o rt leg swelling and went back to urgent care.  They did an ultrasound and said pt had DVT and sent him to the ER.  At ER, pt was prescribed xarelto and given a xarelto starter pack.  Talk to Dr Lilian Coma to see if this is ok with Dr Ellyn Hack and will he need to be followed up here?

## 2016-03-09 NOTE — Telephone Encounter (Signed)
LMTCB-sched appt with Suanne Marker in 1 week

## 2016-03-16 ENCOUNTER — Telehealth: Payer: Self-pay

## 2016-03-16 ENCOUNTER — Other Ambulatory Visit: Payer: Self-pay | Admitting: Cardiovascular Disease

## 2016-03-16 ENCOUNTER — Encounter: Payer: Self-pay | Admitting: Physician Assistant

## 2016-03-16 ENCOUNTER — Ambulatory Visit (INDEPENDENT_AMBULATORY_CARE_PROVIDER_SITE_OTHER): Payer: BLUE CROSS/BLUE SHIELD | Admitting: Physician Assistant

## 2016-03-16 VITALS — BP 128/70 | HR 59 | Ht 70.0 in | Wt 276.6 lb

## 2016-03-16 DIAGNOSIS — I82411 Acute embolism and thrombosis of right femoral vein: Secondary | ICD-10-CM

## 2016-03-16 DIAGNOSIS — I2510 Atherosclerotic heart disease of native coronary artery without angina pectoris: Secondary | ICD-10-CM | POA: Diagnosis not present

## 2016-03-16 MED ORDER — ROSUVASTATIN CALCIUM 20 MG PO TABS: 20.0000 mg | ORAL_TABLET | Freq: Every day | ORAL | 11 refills | Status: DC

## 2016-03-16 MED ORDER — METOPROLOL TARTRATE 25 MG PO TABS: 25.0000 mg | ORAL_TABLET | Freq: Two times a day (BID) | ORAL | 6 refills | Status: DC

## 2016-03-16 MED ORDER — OMEGA-3-ACID ETHYL ESTERS 1 G PO CAPS: 2.0000 | ORAL_CAPSULE | Freq: Two times a day (BID) | ORAL | 11 refills | Status: DC

## 2016-03-16 MED ORDER — LISINOPRIL 10 MG PO TABS: 10.0000 mg | ORAL_TABLET | Freq: Every day | ORAL | 6 refills | Status: DC

## 2016-03-16 NOTE — Progress Notes (Signed)
Cardiology Office Note   Date:  03/16/2016   ID:  Gregory Gregory, DOB June 28, 1955, MRN [id number]  PCP:  Gregory Sheller, MD  Cardiologist:  Dr Gregory Gregory 10/24/2015 Gregory Ferries, PA-C   Chief Complaint  Patient presents with  . Shortness of Breath    DVT    History of Present Illness: Gregory Gregory is a 60 y.o. male with a history of NSTEMI 2012 w/ PCI LAD, D1, OM1, RCA, DM, HTN, HLD, obesity w/ wt loss, MV 10/2015 low-mod risk, remote DVT  10/26 [facility] for RLE pain, dx DVT>>sent to ER 10/26 pm, ER visit for RLE pain There is extensive DVT involving the right lower extremity from the common femoral, into the femoral vein and popliteal veins into the posterior tibial veins of the calf.Will d/c on Xarelto therapy  Gregory Gregory presents for followup of his DVT and evaluation of LE edema. He is here today with s.o.   Greg[place] beach [place] injuring his [place]. Because of his back, he was lying on the couch most of the last 2 days at the beach. He did not get out and move around much, then rode back from the beach, again not getting out of the car much. Until his back got better, he was not moving around much.   Several days prior to the ER visit, he began noticing RLE edema. The edema became worse and he was having significant pain, sparking the UC visit. They sent him to the ER. He has been on the starter Xarelto pack since then. He was taking high-dose ASA + caffeine for his back, but is no longer taking that. His back has gotten better.  The RLE has improved, but he will still get significant pain/swelling if his leg is down for any length of time. When he woke up this am, Gregory Gregory reports the skin was very loose, but it is tightly swollen now.   He has some DOE, but this has not changed significantly. He denies orthopnea or PND. He admits that he eats poorly at times. His job involves being on his feet 10-12 hours a day, there is no light duty or desk  option. He has not had chest pain.    Past Medical History:  Diagnosis Date  . CAD (coronary artery disease) 10/2010   multivessel PCI RCA; OM1; LAD;diag --> Myoview May 2017: Moderate Risk Duke Treadmill Score - negative perfusion images for ischemia or infarction.  . Diabetes mellitus type 2, controlled ([place])    No longer on Medcations after weight loss.  . H/O echocardiogram    EF 0000000; grade 1 diastolic dysfunction; trivial TR  . Hyperlipidemia LDL goal < 70   . Hypertension   . NSTEMI (non-ST elevated myocardial infarction) ([place]) 10/2010   multivessel PCI  . Obesity (BMI 30.0-34.9)    Lost  > 80 lb since NSTEMI  . Presence of drug coated stent in anterior descending branch of left coronary artery June 2012   Resolute DES stents: LAD- 2.75 mm x 14 mm -- 3.3 mm; Diag - 2.25 mm x 12 mm -- 2.3 mm  . Presence of drug coated stent in left circumflex coronary artery June 2012   OM1 -- 2 overlapping Resolute DES Stents: 2.25 mm 36mm, x 86m  . Presence of drug coated stent in right coronary artery June 2012   Mid RCA 2.75 mm x 22 mm    Past Surgical History:  Procedure Laterality Date  .  CORONARY ANGIOPLASTY WITH STENT PLACEMENT  11/03/2010   PCI of mid RCA and proximal OM 1 with Resolute DESs  . CORONARY STENT PLACEMENT  11/02/10   PCI to mid LAD with integrity resolute DES; PCI to proximal second diagonal branch with resolute integrity  . NM MYOVIEW LTD  09/23/2015   LOW RISK : Moderate Risk Duke Treadmill Score - negative perfusion images for ischemia or infarction.  . ORIF FEMUR FRACTURE Right     Current Outpatient Prescriptions  Medication Sig Dispense Refill  . acetaminophen-codeine (TYLENOL #3) 300-30 MG tablet Take 1-2 tablets by mouth every 6 (six) hours as needed for pain.    Marland Kitchen aspirin EC 81 MG tablet Take 81 mg by mouth daily.    . Aspirin-Caffeine (BAYER BACK & BODY PAIN EX ST) 500-32.5 MG TABS Take 1-2 tablets by mouth every 6 (six) hours as needed (for pain).    .  Camphor-Menthol-Methyl Sal 1.2-5.7-6.3 % PTCH Apply 1 patch topically every 6 (six) hours as needed (for back pain).     . metaxalone (SKELAXIN) 400 MG tablet Take 400 mg by mouth 3 (three) times daily.    . metoprolol tartrate (LOPRESSOR) 25 MG tablet Take 1 tablet (25 mg total) by mouth 2 (two) times daily. 60 tablet 3  . omega-3 acid ethyl esters (LOVAZA) 1 g capsule TAKE TWO CAPSULES BY MOUTH TWICE DAILY 120 capsule 11  . Rivaroxaban 15 & 20 MG TBPK Take as directed on package: Start with one 15mg  tablet by mouth twice a day with food. On Day 22, switch to one 20mg  tablet once a day with food. 51 each 0  . rosuvastatin (CRESTOR) 20 MG tablet Take 1 tablet (20 mg total) by mouth daily. 30 tablet 11   No current facility-administered medications for this visit.     Allergies:   Bee venom    Social History:  The patient  reports that he has never smoked. He has never used smokeless tobacco. He reports that he does not drink alcohol or use drugs.   Family History:  The patient's family history includes Cancer in his father, mother, and sister; Heart attack in his maternal grandfather.    ROS:  Please see the history of present illness. All other systems are reviewed and negative.    PHYSICAL EXAM: VS:  BP 128/70   Pulse (!) 59   Ht 5\' 10"  (1.778 m)   Wt 276 lb 9.6 oz (125.5 kg)   BMI 39.69 kg/m  , BMI Body mass index is 39.69 kg/m. GEN: Well nourished, well developed, male in no acute distress  HEENT: normal for age  Neck: no JVD, no carotid bruit, no masses Cardiac: RRR; no murmur, no rubs, or gallops Respiratory:  clear to auscultation bilaterally, normal work of breathing GI: soft, nontender, nondistended, + BS MS: no deformity or atrophy; 1+ RLE edema; distal pulses are 2+ in 3/4 extremities, unable to palpate RLE pedal pulses but cap refill is 3 sec; Varicose veins noted in both legs. Skin: warm and dry, no rash Neuro:  Strength and sensation are intact Psych: euthymic  mood, full affect   EKG:  EKG is ordered today. The ekg ordered today demonstrates Sinus brady, HR 59, no acute changes, no S1Q3T3  MYOVIEW: 09/23/2015  Nuclear stress EF: 60%.  The left ventricular ejection fraction is normal (55-65%).  Blood pressure demonstrated a hypertensive response to exercise.  Horizontal ST segment depression ST segment depression of 1.5 mm was noted during stress in  the II, III, aVF, V4, V5 and V6 leads.  This is a low risk study. >>PER DR HARDING'S NOTE 10/27/2015 low threshold for cath if sx recur 1. Abnormal stress ECG suggestive of ischemia.  2. Normal perfusion images, no ischemia or infarction.  3. Normal EF and wall motion.  4. Overall low risk study given normal perfusion images.    Recent Labs: 08/09/2015: BUN 13; Creatinine, Ser 0.94; Hemoglobin 13.7; Platelets 291; Potassium 4.3; Sodium 136 08/10/2015: ALT 30    Lipid Panel    Component Value Date/Time   CHOL 174 08/10/2015 1001   TRIG 183 (H) 08/10/2015 1001   HDL 35 (L) 08/10/2015 1001   CHOLHDL 5.0 08/10/2015 1001   VLDL 37 (H) 08/10/2015 1001   LDLCALC 102 08/10/2015 1001     Wt Readings from Last 3 Encounters:  03/16/16 276 lb 9.6 oz (125.5 kg)  03/08/16 268 lb (121.6 kg)  10/25/15 263 lb (119.3 kg)     Other studies Reviewed: Additional studies/ records that were reviewed today include: office notes, hospital records and testing  ASSESSMENT AND PLAN:  1.  Acute RLE DVT: Pt has been treated appropriately, is compliant w/ Xarelto. As he has had some improvement and has been on rx for a week now, do not think any further evaluation is needed. His DOE has not changed much, hard to tell because his activity level is so much lower.   Both this DVT and his previous DVT were caused by immobility so do not think hypercoag testing needed.  Plan: Keep out of work for now, he cannot tolerate extensive time on his feet.  Told him it would be at least a few weeks, they will get [place]  the paperwork we need to fill out. **Discuss w/ Dr Gregory Gregory how long he should be out of work. May need to repeat US to confirm clearing of the DVT.**  ** Discuss w/ Dr Gregory Gregory duration of rx **   2. CAD: Encouraged Gregory Gregory and he to work on healthy eating, he has gained some weight and admits to dietary indiscretions. He is compliant w/ ASA 81 mg, metoprolol and Crestor.   Current medicines are reviewed at length with the patient today.  The patient does not have concerns regarding medicines.  The following changes have been made:  no change but he will need an ongoing rx for Xarelto once starter pack completed.  Labs/ tests ordered today include:  No orders of the defined types were placed in this encounter.    Disposition:   FU with Dr Gregory Gregory  Signed, Gregory Ferries, PA-C  03/16/2016 8:45 AM    [facility] Phone: [phone]; Fax: [phone]  This note was written with the assistance of speech recognition software. Please excuse any transcriptional errors.

## 2016-03-16 NOTE — Telephone Encounter (Signed)
Patient was seen in the office today requested note for work. Gregory Gregory spoke with Dr Ellyn Hack and we have  Two choices for the patient  1. Get a note to be out of work for 2 weeks  Or  2. Get a note that takes you out of work until the day after your appointment with Dr Ellyn Hack   left message on patients voice mail awaiting a call back from patient

## 2016-03-16 NOTE — Telephone Encounter (Signed)
error 

## 2016-03-16 NOTE — Patient Instructions (Addendum)
Medication Instructions:  Your physician recommends that you continue on your current medications as directed. Please refer to the Current Medication list given to you today. CONTACT THE OFFICE UPON TAKING YOU LAST TABLET OF XARELTO AND WE WILL SEND IN A NEW RX FOR [place] 20MG  ONCE A DAY TO THE PHARMACY.  Labwork: None   Testing/Procedures: None   Follow-Up: Your physician recommends that you schedule a follow-up appointment in: [place].  Any Other Special Instructions Will Be Listed Below (If Applicable).  DO LEG EXERCISES AT LEAST EVERY HOUR KEEP LEG ELEVATED AS MUCH AS POSSIBLE   RHONDA WILL SPEAK TO DR HARDING ABOUT YOUR WORK NOTE AND GET BACK TO YOU  If you need a refill on your cardiac medications before your next appointment, please call your pharmacy.

## 2016-03-19 ENCOUNTER — Telehealth: Payer: Self-pay | Admitting: Cardiology

## 2016-03-19 NOTE — Telephone Encounter (Signed)
Received Attending Physicians Statement form from [facility] for Dr Ellyn Hack to review, complete and sign.  Received via FAX, no signed AUTH/Payment to [place].  Sent to [employer] [facility] [employer] for letter/packet to be mailed to patient.  Sent to [employer] via [place] on 03/19/16. lp

## 2016-03-22 NOTE — Telephone Encounter (Signed)
Spoke with patient later that day and patient stated he would like to be out of work until his visit with Dr Ellyn Hack.

## 2016-03-26 ENCOUNTER — Telehealth: Payer: Self-pay | Admitting: *Deleted

## 2016-03-26 DIAGNOSIS — E785 Hyperlipidemia, unspecified: Secondary | ICD-10-CM

## 2016-03-26 DIAGNOSIS — E66811 Obesity, class 1: Secondary | ICD-10-CM

## 2016-03-26 DIAGNOSIS — Z9861 Coronary angioplasty status: Secondary | ICD-10-CM

## 2016-03-26 DIAGNOSIS — I10 Essential (primary) hypertension: Secondary | ICD-10-CM

## 2016-03-26 DIAGNOSIS — E669 Obesity, unspecified: Secondary | ICD-10-CM

## 2016-03-26 DIAGNOSIS — R079 Chest pain, unspecified: Secondary | ICD-10-CM

## 2016-03-26 DIAGNOSIS — I2510 Atherosclerotic heart disease of native coronary artery without angina pectoris: Secondary | ICD-10-CM

## 2016-03-26 NOTE — Telephone Encounter (Signed)
-----   Message from Raiford Simmonds, RN sent at 10/25/2015 10:04 AM EDT ----- DUE 04/25/16  MAIL CMP,LIPID

## 2016-03-26 NOTE — Telephone Encounter (Signed)
Mail letter and lab slip Due by 04/25/16

## 2016-03-26 NOTE — Addendum Note (Signed)
Addended by: Leland Johns A on: 03/26/2016 04:36 PM   Modules accepted: Orders

## 2016-04-02 ENCOUNTER — Other Ambulatory Visit: Payer: Self-pay | Admitting: Cardiology

## 2016-04-02 MED ORDER — RIVAROXABAN 20 MG PO TABS: 20.0000 mg | ORAL_TABLET | Freq: Every day | ORAL | 11 refills | Status: DC

## 2016-04-02 NOTE — Telephone Encounter (Signed)
°*  STAT* If patient is at the pharmacy, call can be transferred to refill team.   1. Which medications need to be refilled? (please list name of each medication and dose if known) Xarelto-new prescription  2. Which pharmacy/location (including street and city if local pharmacy) is medication to be sent to?[address],[place]  3. Do they need a 30 day or 90 day supply? 30 and refills

## 2016-04-02 NOTE — Telephone Encounter (Signed)
Rx has been sent to the pharmacy electronically. ° °

## 2016-04-09 ENCOUNTER — Encounter: Payer: Self-pay | Admitting: Cardiology

## 2016-04-09 ENCOUNTER — Ambulatory Visit (INDEPENDENT_AMBULATORY_CARE_PROVIDER_SITE_OTHER): Payer: BLUE CROSS/BLUE SHIELD | Admitting: Cardiology

## 2016-04-09 VITALS — BP 144/78 | HR 56 | Ht 71.0 in | Wt 269.4 lb

## 2016-04-09 DIAGNOSIS — Z9861 Coronary angioplasty status: Secondary | ICD-10-CM

## 2016-04-09 DIAGNOSIS — E785 Hyperlipidemia, unspecified: Secondary | ICD-10-CM

## 2016-04-09 DIAGNOSIS — I2510 Atherosclerotic heart disease of native coronary artery without angina pectoris: Secondary | ICD-10-CM

## 2016-04-09 DIAGNOSIS — I82411 Acute embolism and thrombosis of right femoral vein: Secondary | ICD-10-CM

## 2016-04-09 DIAGNOSIS — E669 Obesity, unspecified: Secondary | ICD-10-CM

## 2016-04-09 DIAGNOSIS — I10 Essential (primary) hypertension: Secondary | ICD-10-CM | POA: Diagnosis not present

## 2016-04-09 NOTE — Patient Instructions (Signed)
NO CHANGES WITH CURRENT MEDICATIONS   You have been referred to  VASCULAR SURGEON - IN REGARDS RIGHT LOWER EXTREMITY EDEMA. OUT OF WORK UNTIL VASCULAR SURGEON DECIDE ON WHEN IT IS TIME TO RETURN TO WORK.   Your physician recommends that you schedule a follow-up appointment in FEB 2018 [place].   If you need a refill on your cardiac medications before your next appointment, please call your pharmacy.
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
## 2016-04-10 ENCOUNTER — Encounter: Payer: Self-pay | Admitting: Cardiology

## 2016-04-10 ENCOUNTER — Telehealth: Payer: Self-pay | Admitting: Cardiology

## 2016-04-10 NOTE — Assessment & Plan Note (Signed)
A little bit more obese now than he was. I think some of this is fluid weight though. Hopefully once his leg is better he will be a get back into doing his routine exercise.

## 2016-04-10 NOTE — Telephone Encounter (Signed)
Received [facility] Attending Physicians Statement forms back from [facility] for Dr Ellyn Hack to review, complete and sign.  Forms put in Dr Allison Quarry correspondence. lp

## 2016-04-10 NOTE — Assessment & Plan Note (Signed)
Notable improvement in his memory loss after switching from atorvastatin to rosuvastatin. He is due to have labs checked in December. We'll go ahead and order this for now.

## 2016-04-10 NOTE — Assessment & Plan Note (Signed)
Doing pretty well after moderate multivessel PCI. Last Myoview showed no ischemia. No recurrent anginal pain. Very active. Hopefully once his leg is healed up he can get back into his exercise routine. On stable dose of beta blocker, ACE inhibitor and statin. No longer on aspirin or Plavix because of Xarelto.

## 2016-04-10 NOTE — Assessment & Plan Note (Signed)
Actually a bit higher today than usual. Probably because of the pain in his leg. For now we'll simply continue current dose of ACE inhibitor and beta blocker.

## 2016-04-10 NOTE — Assessment & Plan Note (Signed)
He still has significant swelling and discomfort in this leg now over a month out from his DVT diagnosis. I'm concerned that he will have long-term thrombophlebitis as a result of this extremely large DVT.  More for sensitive attempting to minimize his thrombus burden, I would like to refer him to vascular surgery to see if there is any potential benefit from direct thrombolysis or mechanical thrombolysis.  He is currently on Xarelto, but still having a lot of pain walking. He needs to know when to go back to work, and after being on his feet more than 1520 minutes he is under extreme amount of pain medicines set down proximal feet up. He can barely put his leg into his pants - mostly wearing Shorts

## 2016-04-12 ENCOUNTER — Encounter: Payer: Self-pay | Admitting: Physician Assistant

## 2016-04-19 ENCOUNTER — Other Ambulatory Visit: Payer: Self-pay

## 2016-04-19 DIAGNOSIS — I82411 Acute embolism and thrombosis of right femoral vein: Secondary | ICD-10-CM

## 2016-04-23 ENCOUNTER — Ambulatory Visit (HOSPITAL_COMMUNITY)
Admission: RE | Admit: 2016-04-23 | Discharge: 2016-04-23 | Disposition: A | Discharge status: Home / Self Care | Payer: BLUE CROSS/BLUE SHIELD | Source: Ambulatory Visit | Attending: Surgery | Admitting: Surgery

## 2016-04-23 ENCOUNTER — Telehealth: Payer: Self-pay | Admitting: Cardiology

## 2016-04-23 DIAGNOSIS — I82411 Acute embolism and thrombosis of right femoral vein: Secondary | ICD-10-CM | POA: Insufficient documentation

## 2016-04-23 DIAGNOSIS — M7989 Other specified soft tissue disorders: Secondary | ICD-10-CM | POA: Insufficient documentation

## 2016-04-23 NOTE — Telephone Encounter (Signed)
Received Signed Attending Physicians Statement Bebe Liter) back from Dr Ellyn Hack.  Notified patient and Faxed Signed Form and Requested Records to [place].

## 2016-04-23 NOTE — Telephone Encounter (Signed)
[name]  From Vascular and Vein  Outpatient [name] is calling to let us know that Gregory GregoryGregory Gregory still has a blood clot in right leg , not a new one  But still acute in some areas and the report will be in the system sometime today . Thanks

## 2016-04-25 NOTE — Telephone Encounter (Signed)
Not surprisingly he still has a clot in the right leg. This was known and he is on anticoagulation. The question is is the extent of the clot. It appears to be smaller than what was originally described.  He is already on anticoagulation. Elevating his legs. And has a scheduled appointment with vascular surgery to see this also needs to be done. Hopefully they will have this data to review and it may mean that nothing else needs to be done.  Continue full anticoagulation for now.

## 2016-04-25 NOTE — Telephone Encounter (Signed)
Forward to DR [facility] Not sure if yo have seen reprt  appointment is  schedule with DR Bridgett Larsson 05/02/16

## 2016-04-26 ENCOUNTER — Encounter: Payer: Self-pay | Admitting: Vascular Surgery

## 2016-05-02 ENCOUNTER — Encounter: Payer: Self-pay | Admitting: Vascular Surgery

## 2016-05-02 ENCOUNTER — Ambulatory Visit (INDEPENDENT_AMBULATORY_CARE_PROVIDER_SITE_OTHER): Payer: BLUE CROSS/BLUE SHIELD | Admitting: Vascular Surgery

## 2016-05-02 VITALS — BP 132/79 | HR 74 | Temp 97.1°F | Resp 16 | Ht 71.0 in | Wt 265.0 lb

## 2016-05-02 DIAGNOSIS — I82411 Acute embolism and thrombosis of right femoral vein: Secondary | ICD-10-CM
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
## 2016-05-31 ENCOUNTER — Ambulatory Visit: Payer: BLUE CROSS/BLUE SHIELD | Admitting: Nurse Practitioner

## 2016-06-06 ENCOUNTER — Encounter: Payer: Self-pay | Admitting: Nurse Practitioner

## 2016-06-06 ENCOUNTER — Ambulatory Visit (INDEPENDENT_AMBULATORY_CARE_PROVIDER_SITE_OTHER): Payer: BLUE CROSS/BLUE SHIELD | Admitting: Nurse Practitioner

## 2016-06-06 VITALS — BP 120/82 | HR 74 | Ht 71.0 in

## 2016-06-06 DIAGNOSIS — E782 Mixed hyperlipidemia: Secondary | ICD-10-CM

## 2016-06-06 DIAGNOSIS — I10 Essential (primary) hypertension: Secondary | ICD-10-CM

## 2016-06-06 DIAGNOSIS — I82511 Chronic embolism and thrombosis of right femoral vein: Secondary | ICD-10-CM | POA: Diagnosis not present

## 2016-06-06 DIAGNOSIS — I2510 Atherosclerotic heart disease of native coronary artery without angina pectoris: Secondary | ICD-10-CM | POA: Diagnosis not present

## 2016-06-06 NOTE — Patient Instructions (Addendum)
Medication Instructions:  Your physician recommends that you continue on your current medications as directed. Please refer to the Current Medication list given to you today.   Labwork: None ordered  Testing/Procedures: None ordered  Follow-Up: Your physician recommends that you schedule a follow-up appointment in: 3 MONTHS WITH DR. HARDING   Any Other Special Instructions Will Be Listed Below (If Applicable).   If you need a refill on your cardiac medications before your next appointment, please call your pharmacy.   
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
## 2016-07-04 ENCOUNTER — Telehealth: Payer: Self-pay | Admitting: *Deleted

## 2016-07-04 NOTE — Telephone Encounter (Signed)
Called informed pharmacy   Prior authorization for Generic lovaza has been approved.  Use of cover my med-- key # [id number] Answer  -1- pre trig levels before use 570 @ 5 years   (10/31/2010)      2 - patient is also taking generic Crestor 20 mg

## 2016-08-28 ENCOUNTER — Ambulatory Visit (INDEPENDENT_AMBULATORY_CARE_PROVIDER_SITE_OTHER): Payer: BLUE CROSS/BLUE SHIELD | Admitting: Cardiology

## 2016-08-28 ENCOUNTER — Encounter: Payer: Self-pay | Admitting: Cardiology

## 2016-08-28 VITALS — BP 122/70 | HR 60 | Ht 71.0 in | Wt 275.0 lb

## 2016-08-28 DIAGNOSIS — I214 Non-ST elevation (NSTEMI) myocardial infarction: Secondary | ICD-10-CM | POA: Diagnosis not present

## 2016-08-28 DIAGNOSIS — R600 Localized edema: Secondary | ICD-10-CM | POA: Diagnosis not present

## 2016-08-28 DIAGNOSIS — E669 Obesity, unspecified: Secondary | ICD-10-CM | POA: Diagnosis not present

## 2016-08-28 DIAGNOSIS — E785 Hyperlipidemia, unspecified: Secondary | ICD-10-CM

## 2016-08-28 DIAGNOSIS — I82411 Acute embolism and thrombosis of right femoral vein: Secondary | ICD-10-CM

## 2016-08-28 DIAGNOSIS — R413 Other amnesia: Secondary | ICD-10-CM

## 2016-08-28 DIAGNOSIS — I2510 Atherosclerotic heart disease of native coronary artery without angina pectoris: Secondary | ICD-10-CM | POA: Diagnosis not present

## 2016-08-28 DIAGNOSIS — I10 Essential (primary) hypertension: Secondary | ICD-10-CM

## 2016-08-28 DIAGNOSIS — Z9861 Coronary angioplasty status: Secondary | ICD-10-CM

## 2016-08-28 LAB — LIPID PANEL
CHOL/HDL RATIO: 5.5 ratio — AB (ref <5.0)
Cholesterol: 160 mg/dL (ref <200)
HDL: 29 mg/dL — ABNORMAL LOW (ref >40)
Triglycerides: 411 mg/dL — ABNORMAL HIGH (ref <150)

## 2016-08-28 LAB — LDL CHOLESTEROL, DIRECT: LDL DIRECT: 64 mg/dL (ref <130)

## 2016-08-28 LAB — COMPREHENSIVE METABOLIC PANEL
ALK PHOS: 80 U/L (ref 40–115)
ALT: 26 U/L (ref 9–46)
AST: 18 U/L (ref 10–35)
Albumin: 4.2 g/dL (ref 3.6–5.1)
BUN: 21 mg/dL (ref 7–25)
CHLORIDE: 100 mmol/L (ref 98–110)
CO2: 25 mmol/L (ref 20–31)
CREATININE: 1.08 mg/dL (ref 0.70–1.33)
Calcium: 9.5 mg/dL (ref 8.6–10.3)
GLUCOSE: 326 mg/dL — AB (ref 65–99)
POTASSIUM: 4.5 mmol/L (ref 3.5–5.3)
SODIUM: 134 mmol/L — AB (ref 135–146)
Total Bilirubin: 0.3 mg/dL (ref 0.2–1.2)
Total Protein: 7.6 g/dL (ref 6.1–8.1)

## 2016-08-28 MED ORDER — FUROSEMIDE 20 MG PO TABS
20.0000 mg | ORAL_TABLET | Freq: Every day | ORAL | 11 refills | Status: DC | PRN
Start: 2016-08-28 — End: 2017-02-26

## 2016-08-28 NOTE — Assessment & Plan Note (Signed)
Stable blood pressure today on low-dose beta blocker and ACE inhibitor.

## 2016-08-28 NOTE — Assessment & Plan Note (Addendum)
He did have improved memory loss issues after switching to Crestor. We are checking labs today.  He is on omega-3 fatty acids and Crestor.  - Labs checked today show relatively normal direct LDL, but significant elevated triglycerides. He is on omega-3 fatty acids, will need to discuss dietary modifications. He did eat late last night, and that could be reason for triglycerides still being elevated. - High triglycerides is not been an issue with him in the past. For now I would simply wait until we recheck after next visit.

## 2016-08-28 NOTE — Assessment & Plan Note (Signed)
Swelling seems to be an issue now, especially after his most recent DVT. At this point I think it would be reasonable to give him when necessary dose of Lasix if the swelling is worse. I recommended that he take daily for the first few days to see if he gets a low volume off and help the compression stockings/support hose to work. After that he can take it as needed. A couple times per week / as much as necessary.

## 2016-08-28 NOTE — Assessment & Plan Note (Signed)
Status post multivessel PCI. Doing quite well. He had a Myoview negative for ischemia and no recurrent anginal symptoms. Very active now that his leg is improving. No longer on an antiplatelet agent because he is on Xarelto.  He is on stable dose of beta blocker and ACE inhibitor along with aspirin and statin.

## 2016-08-28 NOTE — Assessment & Plan Note (Signed)
Improved after stopping Lipitor. Not noticing similar symptoms on Crestor/rosuvastatin.

## 2016-08-28 NOTE — Patient Instructions (Signed)
Labs today-FASTING CMP LIPIDS     MEDICATION  ADDITION  FUROSEMIDE 20 MG(LASIX)  MAY TAKE ONE TABLET DAILY UP TO 5 DAYS A WEEK AS NEEDED FOR SWELLING  FOR NEXT 3 DAYS TAKE ONE TABLET DAILY.    Your physician wants you to follow-up in [place].You will receive a reminder letter in the mail two months in advance. If you don't receive a letter, please call our office to schedule the follow-up appointment.   If you need a refill on your cardiac medications before your next appointment, please call your pharmacy.

## 2016-08-28 NOTE — Assessment & Plan Note (Signed)
Mildly reduced EF but no real significant CHF symptoms. He has swelling I don't think it is related to CHF probably more venous stasis changes. On stable regimen at this time. No ischemia noted on most recent Myoview.

## 2016-08-28 NOTE — Assessment & Plan Note (Signed)
Chronic, notably improved swelling. He is now agreeable to wearing compression stockings on now the swelling is improved. He has an indwelling IVC filter, and based on vascular surgery recommendations, we'll probably keep him on Xarelto lifelong. Once the swelling is fully gone down to baseline, we may temporarily stop his Xarelto in order to check hypercoagulable panel.
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
## 2016-09-04 ENCOUNTER — Telehealth: Payer: Self-pay | Admitting: *Deleted

## 2016-09-04 NOTE — Telephone Encounter (Signed)
-----   Message from Leonie Man, MD sent at 08/29/2016  5:34 PM EDT ----- Electrolytes/chemistry panel shows a pretty high blood sugars of 326. Otherwise essentially normal kidney function and electrolytes. Normal LFTs/liver function tests. Cholesterol panel shows total cholesterol 160, unable to calculate LDL because triglycerides were too high. I suspect this may be related to fasting. Can recheck in 3 months.  [name], MD

## 2016-09-04 NOTE — Telephone Encounter (Signed)
LEFT MESSAGE TO CALL BACK -ARE YOU ON DIABETIC MEDICATION? -RECHECK  FASTINGLABS IN 3 MONTH?

## 2016-09-19 NOTE — Telephone Encounter (Signed)
patient reviewed result via mychart  Will mail labs in 3 months

## 2016-09-20 ENCOUNTER — Other Ambulatory Visit: Payer: Self-pay | Admitting: Physician Assistant

## 2016-09-20 NOTE — Telephone Encounter (Signed)
REFILL 

## 2016-11-15 ENCOUNTER — Telehealth: Payer: Self-pay | Admitting: *Deleted

## 2016-11-15 DIAGNOSIS — Z79899 Other long term (current) drug therapy: Secondary | ICD-10-CM

## 2016-11-15 DIAGNOSIS — E785 Hyperlipidemia, unspecified: Secondary | ICD-10-CM

## 2016-11-15 DIAGNOSIS — I2510 Atherosclerotic heart disease of native coronary artery without angina pectoris: Secondary | ICD-10-CM

## 2016-11-15 DIAGNOSIS — Z9861 Coronary angioplasty status: Secondary | ICD-10-CM

## 2016-11-15 NOTE — Telephone Encounter (Signed)
cmp ,lipid Labs slip and letter

## 2016-11-15 NOTE — Telephone Encounter (Signed)
-----   Message from Raiford Simmonds, RN sent at 09/19/2016  1:53 PM EDT ----- Need labs cmp ,lipids due 12/22/16   Mail @7 /11/18 orders are needed

## 2016-12-14 LAB — COMPREHENSIVE METABOLIC PANEL
ALBUMIN: 4.5 g/dL (ref 3.5–5.5)
ALT: 21 IU/L (ref 0–44)
AST: 16 IU/L (ref 0–40)
Albumin/Globulin Ratio: 1.3 (ref 1.2–2.2)
Alkaline Phosphatase: 62 IU/L (ref 39–117)
BUN / CREAT RATIO: 20 (ref 9–20)
BUN: 19 mg/dL (ref 6–24)
Bilirubin Total: 0.3 mg/dL (ref 0.0–1.2)
CALCIUM: 9.4 mg/dL (ref 8.7–10.2)
CO2: 21 mmol/L (ref 20–29)
Chloride: 99 mmol/L (ref 96–106)
Creatinine, Ser: 0.97 mg/dL (ref 0.76–1.27)
GFR calc Af Amer: 98 mL/min/{1.73_m2} (ref >59)
GFR, EST NON AFRICAN AMERICAN: 85 mL/min/{1.73_m2} (ref >59)
GLOBULIN, TOTAL: 3.4 g/dL (ref 1.5–4.5)
GLUCOSE: 229 mg/dL — AB (ref 65–99)
Potassium: 4.3 mmol/L (ref 3.5–5.2)
SODIUM: 136 mmol/L (ref 134–144)
Total Protein: 7.9 g/dL (ref 6.0–8.5)

## 2016-12-14 LAB — LIPID PANEL
CHOLESTEROL TOTAL: 177 mg/dL (ref 100–199)
Chol/HDL Ratio: 4.9 ratio (ref 0.0–5.0)
HDL: 36 mg/dL — ABNORMAL LOW (ref >39)
LDL Calculated: 87 mg/dL (ref 0–99)
Triglycerides: 272 mg/dL — ABNORMAL HIGH (ref 0–149)
VLDL Cholesterol Cal: 54 mg/dL — ABNORMAL HIGH (ref 5–40)

## 2017-01-02 ENCOUNTER — Other Ambulatory Visit: Payer: Self-pay | Admitting: Cardiology

## 2017-01-02 NOTE — Telephone Encounter (Signed)
Rx(s) sent to pharmacy electronically.  

## 2017-02-26 ENCOUNTER — Ambulatory Visit (INDEPENDENT_AMBULATORY_CARE_PROVIDER_SITE_OTHER): Payer: BLUE CROSS/BLUE SHIELD | Admitting: Cardiology

## 2017-02-26 ENCOUNTER — Encounter: Payer: Self-pay | Admitting: Cardiology

## 2017-02-26 VITALS — BP 130/78 | HR 54 | Ht 71.0 in | Wt 258.0 lb

## 2017-02-26 DIAGNOSIS — I82411 Acute embolism and thrombosis of right femoral vein: Secondary | ICD-10-CM

## 2017-02-26 DIAGNOSIS — Z9861 Coronary angioplasty status: Secondary | ICD-10-CM

## 2017-02-26 DIAGNOSIS — I2510 Atherosclerotic heart disease of native coronary artery without angina pectoris: Secondary | ICD-10-CM

## 2017-02-26 DIAGNOSIS — I10 Essential (primary) hypertension: Secondary | ICD-10-CM

## 2017-02-26 DIAGNOSIS — E785 Hyperlipidemia, unspecified: Secondary | ICD-10-CM | POA: Diagnosis not present

## 2017-02-26 DIAGNOSIS — E669 Obesity, unspecified: Secondary | ICD-10-CM

## 2017-02-26 DIAGNOSIS — R600 Localized edema: Secondary | ICD-10-CM

## 2017-02-26 NOTE — Assessment & Plan Note (Signed)
Stable blood pressures on low-dose beta blocker and ACE inhibitor

## 2017-02-26 NOTE — Patient Instructions (Signed)
No medication changes    Labs IN FEB 2019 , (LIPID, CMP , HYPERCOAG PANEL)PLEASE HAVE THESE LABS DONE.DO NOT EAT OR DRINK THE MORNING OF THE TEST. PLEASE DO NOT TAKE THE XARELTO FOR 48 HOURS PRIOR TO TEST AS WELL.  WILL MAIL YOU LABSLIP AT APPROPRIATE TIME.   RESTART WEARING SUPPORT STOCKING DURING THE DAY.    Your physician recommends that you schedule a follow-up appointment in MARCH 2019 [place].  If you need a refill on your cardiac medications before your next appointment, please call your pharmacy.

## 2017-02-26 NOTE — Assessment & Plan Note (Signed)
Chronic right leg DVT. Plan is to stay on lifelong Xarelto. I had intended to do a hypercoagulable check -- I will have him hold his Xarelto and get these labs checked in February when he has his follow-up lipids.  I do recommend at least on the right side from to wear support stockings. If he wears lighter weight stockings.

## 2017-02-26 NOTE — Assessment & Plan Note (Signed)
History of multivessel PCI doing well with no recurrent angina. Negative Myoview in follow-up. Continues to be very active with no major symptoms. He is on low-dose beta blocker and ACE inhibitor. He is on aspirin and statin.  No changes.

## 2017-02-26 NOTE — Assessment & Plan Note (Signed)
Better controlled now. No requirement for diuretic. Recommend light weight support stockings to avoid progression of venous stasis.
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
## 2017-02-26 NOTE — Assessment & Plan Note (Signed)
He is on omega-3 fatty acids and Crestor/rosuvastatin. LDL was 87 and triglycerides of 262. I suspect that this may be related to his diabetes and proximal syndrome. With him now changing his diet based on diabetes, and being on metformin, I would like to give it at least until February to recheck labs before potentially increasing Crestor dose.  Plan: Continue current dose Crestor and recheck lipids in February.

## 2017-02-26 NOTE — Assessment & Plan Note (Signed)
He probably had a bit overweight when his leg got really swollen. He done very well before that. Hopefully now with the diagnosis of diabetes, this will symptoms another impetus for him to get back into her diet and exercise program.

## 2017-03-07 ENCOUNTER — Other Ambulatory Visit: Payer: Self-pay | Admitting: Cardiology

## 2017-03-07 ENCOUNTER — Other Ambulatory Visit: Payer: Self-pay | Admitting: Physician Assistant

## 2017-06-06 ENCOUNTER — Telehealth: Payer: Self-pay | Admitting: *Deleted

## 2017-06-06 DIAGNOSIS — I82411 Acute embolism and thrombosis of right femoral vein: Secondary | ICD-10-CM

## 2017-06-06 DIAGNOSIS — I2510 Atherosclerotic heart disease of native coronary artery without angina pectoris: Secondary | ICD-10-CM

## 2017-06-06 DIAGNOSIS — E785 Hyperlipidemia, unspecified: Secondary | ICD-10-CM

## 2017-06-06 DIAGNOSIS — Z9861 Coronary angioplasty status: Principal | ICD-10-CM

## 2017-06-06 DIAGNOSIS — R600 Localized edema: Secondary | ICD-10-CM

## 2017-06-06 NOTE — Telephone Encounter (Signed)
-----   Message from Raiford Simmonds, RN sent at 02/26/2017  9:10 AM EDT ----- LIPID ,CMP HYPERCOG DUE FEB 15

## 2017-06-06 NOTE — Telephone Encounter (Signed)
Mailed lab slip and letter 

## 2017-06-18 LAB — COMPREHENSIVE METABOLIC PANEL
ALT: 18 IU/L (ref 0–44)
AST: 18 IU/L (ref 0–40)
Albumin/Globulin Ratio: 1.9 (ref 1.2–2.2)
Albumin: 4.8 g/dL (ref 3.6–4.8)
Alkaline Phosphatase: 43 IU/L (ref 39–117)
BILIRUBIN TOTAL: 0.3 mg/dL (ref 0.0–1.2)
BUN/Creatinine Ratio: 26 — ABNORMAL HIGH (ref 10–24)
BUN: 25 mg/dL (ref 8–27)
CHLORIDE: 106 mmol/L (ref 96–106)
CO2: 23 mmol/L (ref 20–29)
Calcium: 9.3 mg/dL (ref 8.6–10.2)
Creatinine, Ser: 0.95 mg/dL (ref 0.76–1.27)
GFR calc Af Amer: 100 mL/min/{1.73_m2} (ref >59)
GFR calc non Af Amer: 87 mL/min/{1.73_m2} (ref >59)
Globulin, Total: 2.5 g/dL (ref 1.5–4.5)
Glucose: 110 mg/dL — ABNORMAL HIGH (ref 65–99)
POTASSIUM: 4.6 mmol/L (ref 3.5–5.2)
Sodium: 143 mmol/L (ref 134–144)
Total Protein: 7.3 g/dL (ref 6.0–8.5)

## 2017-06-18 LAB — LIPID PANEL
Chol/HDL Ratio: 3.2 ratio (ref 0.0–5.0)
Cholesterol, Total: 127 mg/dL (ref 100–199)
HDL: 40 mg/dL (ref >39)
LDL Calculated: 72 mg/dL (ref 0–99)
TRIGLYCERIDES: 73 mg/dL (ref 0–149)
VLDL Cholesterol Cal: 15 mg/dL (ref 5–40)

## 2017-06-23 IMAGING — CR DG CHEST 2V
2 series · 2 of 2 positions shown · non-contrast
Comparison: 10/30/2010

CLINICAL DATA: Chest pain for 1 day

EXAM:
CHEST  2 VIEW

[chest pa]
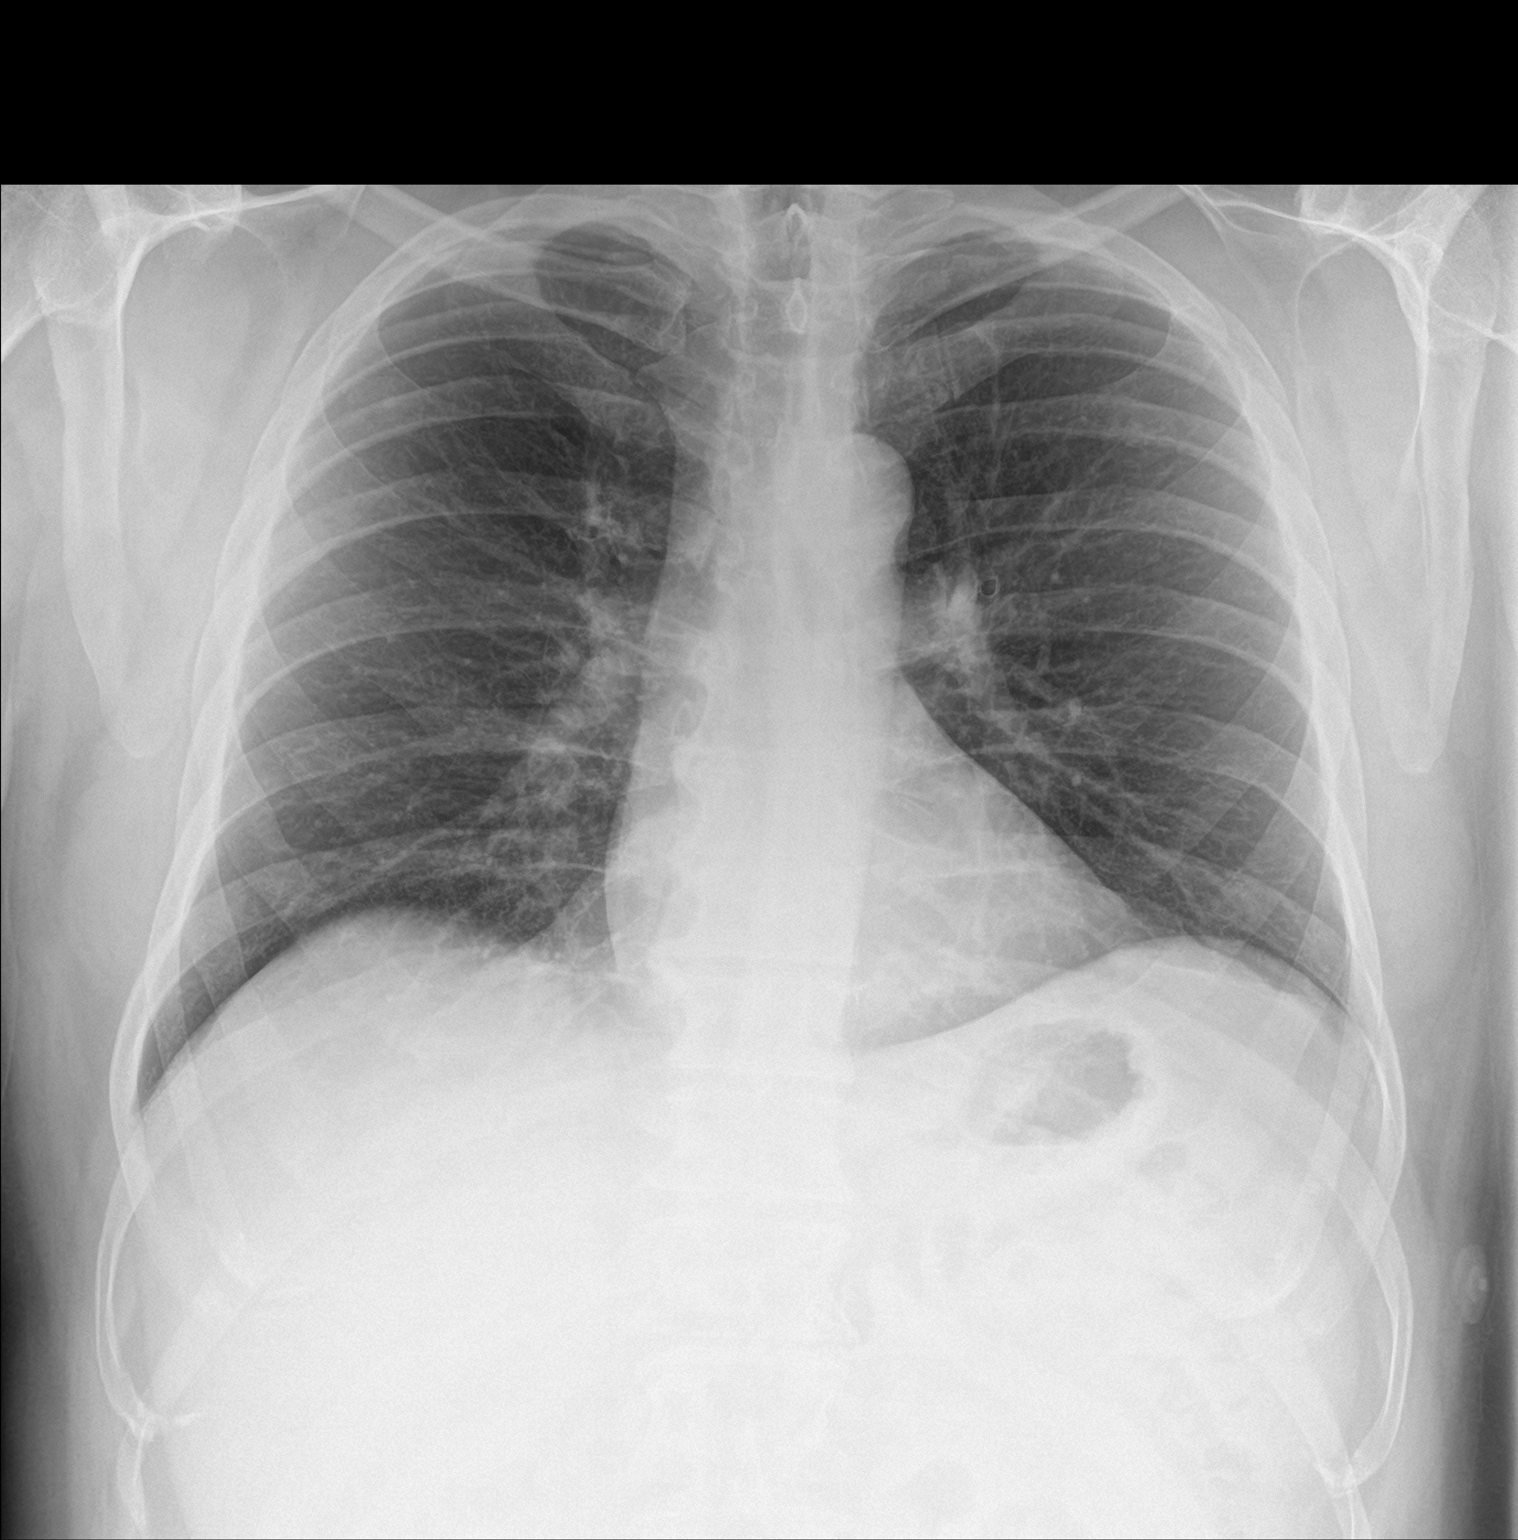

[chest lat]
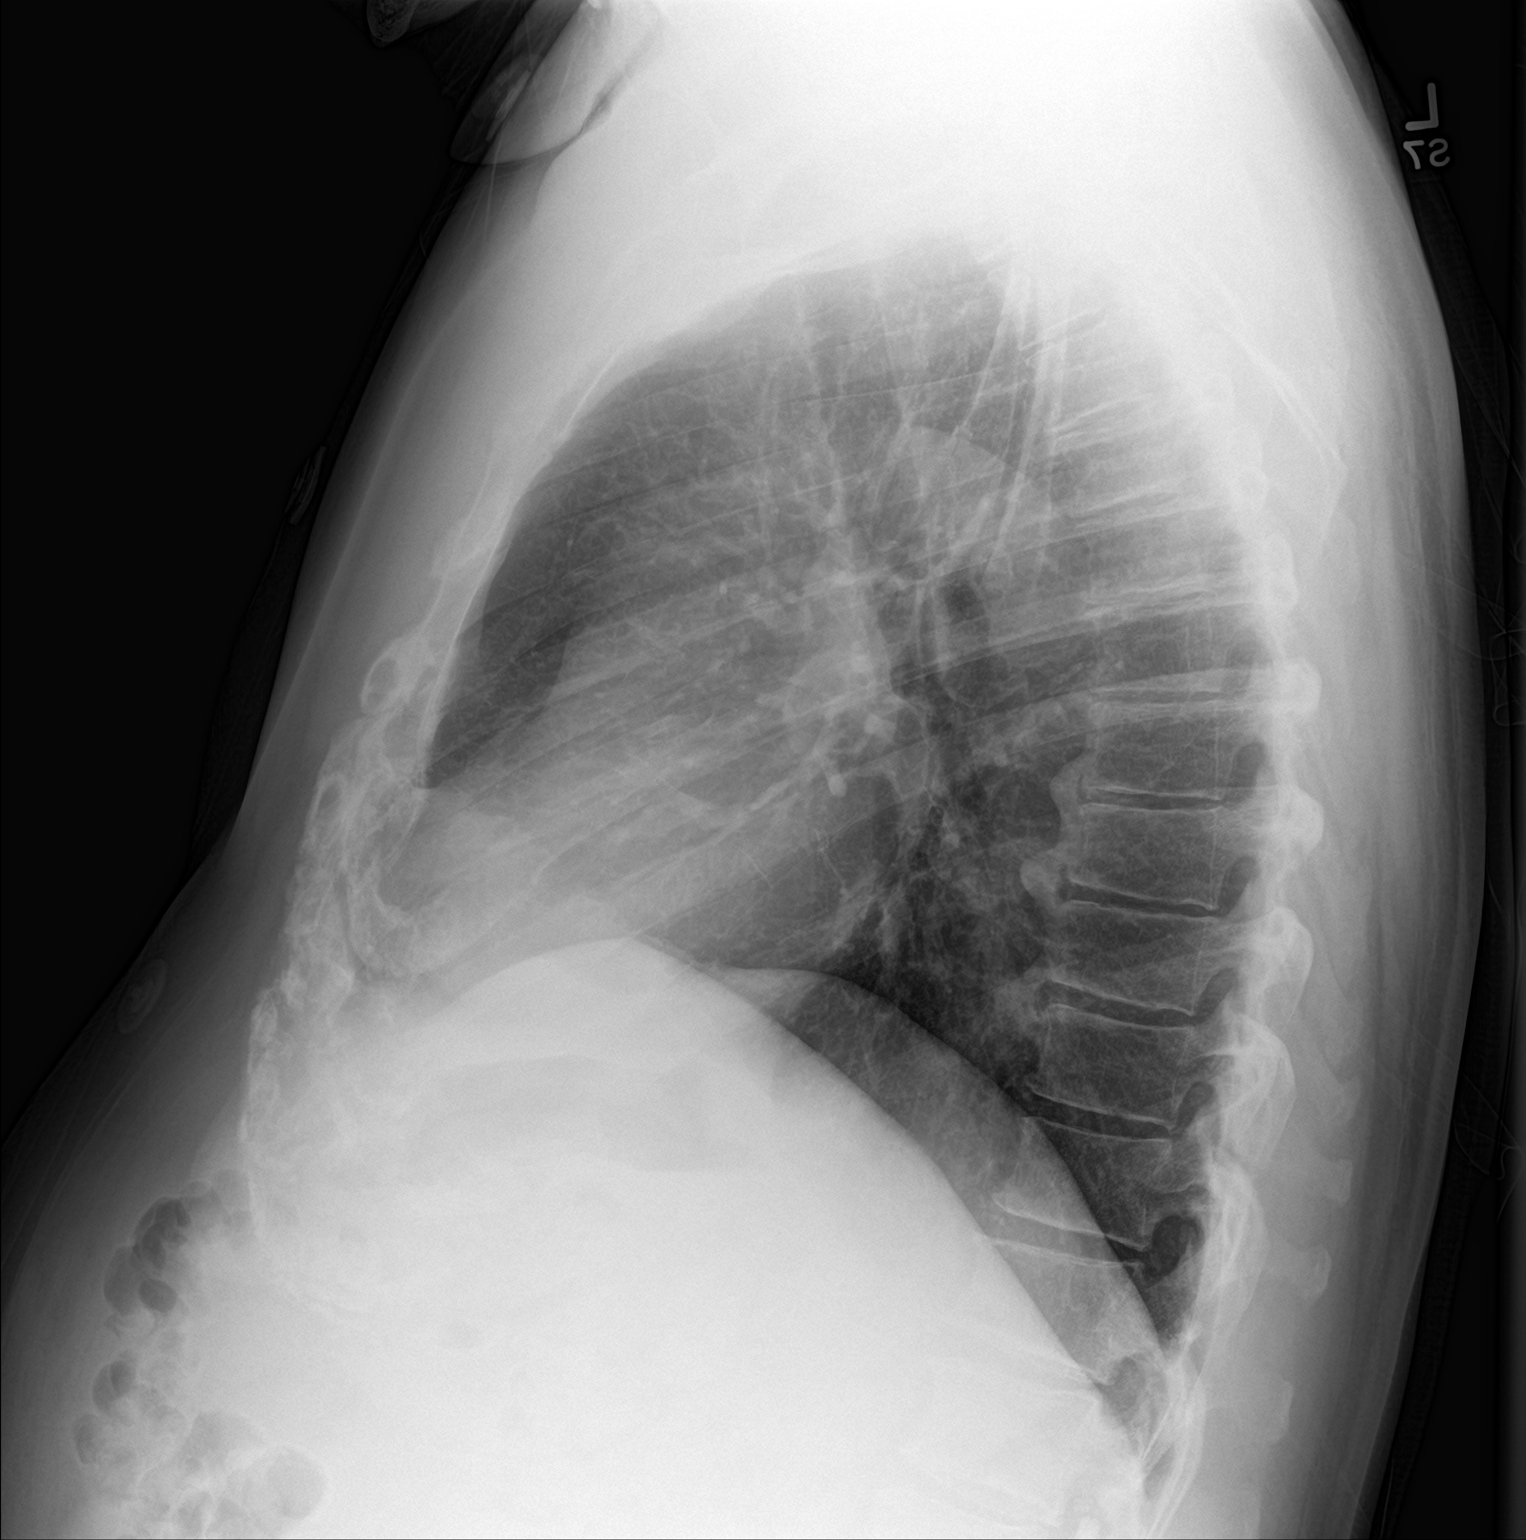

[2 of 2 positions shown; findings below may reference images not displayed]

FINDINGS: The heart size and mediastinal contours are within normal limits.
Both lungs are clear. The visualized skeletal structures are
unremarkable.
IMPRESSION: No active cardiopulmonary disease.

## 2017-06-25 LAB — HYPERCOAGULABLE PANEL, COMPREHENSIVE
ACT. PRT C RESIST W/FV DEFIC.: 2.7 ratio
APTT: 30.4 s — AB
AT III Act/Nor PPP Chro: 114 %
Anticardiolipin Ab, IgG: <10 [GPL'U]
Anticardiolipin Ab, IgM: <10 [MPL'U]
BETA-2 GLYCOPROTEIN I, IGG: 11 SGU
DRVVT Confirm Seconds: 40.5 s
DRVVT Ratio: 1.1 ratio
DRVVT SCREEN SECONDS: 49.1 s — AB
FACTOR VII ANTIGEN: 124 %
FACTOR VIII ACTIVITY: 88 %
Hexagonal Phospholipid Neutral: 13 s — ABNORMAL HIGH
Homocysteine: 12 umol/L
PROT S AG ACT/NOR PPP IMM: 124 %
PROTEIN C AG/FVII AG RATIO: 0.9 ratio
PROTEIN S AG/FVII AG RATIO: 1 ratio
Prot C Ag Act/Nor PPP Imm: 115 %

## 2017-06-27 ENCOUNTER — Other Ambulatory Visit: Payer: Self-pay | Admitting: Cardiology

## 2017-06-27 ENCOUNTER — Other Ambulatory Visit: Payer: Self-pay | Admitting: Physician Assistant

## 2017-06-27 NOTE — Telephone Encounter (Signed)
REFILL 

## 2017-07-18 ENCOUNTER — Telehealth: Payer: Self-pay | Admitting: Cardiology

## 2017-07-18 NOTE — Telephone Encounter (Signed)
Pt got a reminder for appt 07-23-17 with Ellyn Hack and he wants to make sure its still needed, his blood work was normal and needs a recheck in 6 mos, was that just for the labs or harding we well?

## 2017-07-18 NOTE — Telephone Encounter (Signed)
Returned call to patient, advised patient the recheck in 6 months was in reference to lab work.  Advised to keep appt on 3/12 with Dr. Ellyn Hack.  Patient verbalized understanding.

## 2017-07-23 ENCOUNTER — Ambulatory Visit (INDEPENDENT_AMBULATORY_CARE_PROVIDER_SITE_OTHER): Payer: BLUE CROSS/BLUE SHIELD | Admitting: Cardiology

## 2017-07-23 ENCOUNTER — Encounter: Payer: Self-pay | Admitting: Cardiology

## 2017-07-23 VITALS — BP 126/71 | HR 49 | Ht 70.0 in | Wt 245.0 lb

## 2017-07-23 DIAGNOSIS — E1169 Type 2 diabetes mellitus with other specified complication: Secondary | ICD-10-CM

## 2017-07-23 DIAGNOSIS — I82411 Acute embolism and thrombosis of right femoral vein: Secondary | ICD-10-CM

## 2017-07-23 DIAGNOSIS — I10 Essential (primary) hypertension: Secondary | ICD-10-CM | POA: Diagnosis not present

## 2017-07-23 DIAGNOSIS — R600 Localized edema: Secondary | ICD-10-CM

## 2017-07-23 DIAGNOSIS — Z9861 Coronary angioplasty status: Secondary | ICD-10-CM

## 2017-07-23 DIAGNOSIS — I214 Non-ST elevation (NSTEMI) myocardial infarction: Secondary | ICD-10-CM | POA: Diagnosis not present

## 2017-07-23 DIAGNOSIS — E785 Hyperlipidemia, unspecified: Secondary | ICD-10-CM

## 2017-07-23 DIAGNOSIS — I2510 Atherosclerotic heart disease of native coronary artery without angina pectoris: Secondary | ICD-10-CM
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
## 2017-07-23 NOTE — Patient Instructions (Signed)
NO MEDICATION CHANGES   LABS IN IN 6 MONTHS -- SEPT 2019 CMP LIPIDS WILL MAIL LAB SLIP IN AUG 2019     Your physician wants you to follow-up in 12 MONTHS WITH DR HARDING. You will receive a reminder letter in the mail two months in advance. If you don't receive a letter, please call our office to schedule the follow-up appointment.   If you need a refill on your cardiac medications before your next appointment, please call your pharmacy.

## 2017-07-25 NOTE — Assessment & Plan Note (Signed)
History of multivessel PCI (essentially in lieu of CABG).  Doing quite well with no anginal symptoms.  He is quite active.  He has not had any symptoms since his PCI and is doing quite well now losing weight.  He had a follow-up Myoview done back In May 2017 for some atypical chest discomfort and that was negative for ischemia.  Doing well now. He is on aspirin and Xarelto so no Thienopyridine.  He is on beta-blocker, ACE inhibitor and statin.

## 2017-07-25 NOTE — Assessment & Plan Note (Addendum)
Just barely now with a BMI of 35, but still with risk factors of hypertension and hyperlipidemia as well as diabetes.  With continued weight loss, would likely be able to drop out of the morbidly obese criteria.  There is definitely concern for metabolic syndrome hence aggressive blood pressure, glycemic and lipid management along with weight loss.  Doing very well now 30 pounds in a year.  He continues at this rate, he may get his BMI down to below 30.

## 2017-07-25 NOTE — Assessment & Plan Note (Signed)
As usual, well controlled on current dose of lisinopril and beta-blocker.  Cannot really increase the dose of beta-blocker because of bradycardia.

## 2017-07-25 NOTE — Assessment & Plan Note (Signed)
Mildly reduced EF with no signs of heart failure after his MI.  No further angina.

## 2017-07-25 NOTE — Assessment & Plan Note (Signed)
Likely due to venous stasis.  The right that is clearly probably because of prior DVT.  No heart failure symptoms of PND orthopnea so it is not cardiac.  He is on low-dose Lasix.

## 2017-07-25 NOTE — Assessment & Plan Note (Addendum)
On combination of rosuvastatin and Lovaza.  (May consider converting to Vascepa due to [facility] definitive benefit in recent studies).   Pretty much at goal with an LDL of6072.  I am hoping that with his continued diet, his lipids will continue to improve.  At this point I would continue just on the current dose of Crestor, but may consider bumping it up every other day in order to get his LDL down closer to 50.    Check labs in September , And then prior to annual follow-up.

## 2017-07-25 NOTE — Assessment & Plan Note (Signed)
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  Status post IVC filter placement.  Essentially now on Xarelto lifelong because of extensive thrombus.  Wearing right side compression stocking which really seems to be helping out the edema.  He started off with a lighter weight and has built up to the higher weight and says that it works better.  Taking very low dose of Lasix.

## 2017-09-20 ENCOUNTER — Other Ambulatory Visit: Payer: Self-pay | Admitting: Cardiology

## 2017-10-03 ENCOUNTER — Encounter: Payer: Self-pay | Admitting: Nurse Practitioner

## 2017-10-24 ENCOUNTER — Encounter: Payer: Self-pay | Admitting: Nurse Practitioner

## 2017-10-24 ENCOUNTER — Ambulatory Visit (INDEPENDENT_AMBULATORY_CARE_PROVIDER_SITE_OTHER): Payer: BLUE CROSS/BLUE SHIELD | Admitting: Nurse Practitioner

## 2017-10-24 ENCOUNTER — Telehealth: Payer: Self-pay

## 2017-10-24 VITALS — BP 130/80 | HR 54 | Ht 70.0 in | Wt 239.0 lb

## 2017-10-24 DIAGNOSIS — Z1211 Encounter for screening for malignant neoplasm of colon: Secondary | ICD-10-CM

## 2017-10-24 DIAGNOSIS — Z7901 Long term (current) use of anticoagulants: Secondary | ICD-10-CM | POA: Diagnosis not present

## 2017-10-24 MED ORDER — NA SULFATE-K SULFATE-MG SULF 17.5-3.13-1.6 GM/177ML PO SOLN: ORAL | 0 refills | Status: DC

## 2017-10-24 NOTE — Patient Instructions (Addendum)
If you are age 61 or older, your body mass index should be between 23-30. Your Body mass index is 34.29 kg/m. If this is out of the aforementioned range listed, please consider follow up with your Primary Care Provider.  If you are age 63 or younger, your body mass index should be between 19-25. Your Body mass index is 34.29 kg/m. If this is out of the aformentioned range listed, please consider follow up with your Primary Care Provider.   You have been scheduled for a colonoscopy. Please follow written instructions given to you at your visit today.  Please pick up your prep supplies at the pharmacy within the next 1-3 days. If you use inhalers (even only as needed), please bring them with you on the day of your procedure. Your physician has requested that you go to www.startemmi.com and enter the access code given to you at your visit today. This web site gives a general overview about your procedure. However, you should still follow specific instructions given to you by our office regarding your preparation for the procedure.  We have sent the following medications to your pharmacy for you to pick up at your convenience: [place] will be contacted by our office prior to your procedure for directions on holding your Xarelto. If you do not hear from our office 1 week prior to your scheduled procedure, please call [phone] to discuss.   Thank you for choosing me and [facility].   Tye Savoy, NP

## 2017-10-24 NOTE — Telephone Encounter (Signed)
Patient with diagnosis of DVTon Xarelto for anticoagulation.    Procedure: colonoscopy Date of procedure: 11/13/17  Patient history significant for 2 DVT in the past, both provoked (2005, 2017).     CrCl 120.4 Platelet count 216  Per office protocol, patient can hold Xarelto for 1 days prior to procedure.    Patient should restart Xarelto evening of procedure or day after, at discretion of procedure MD
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
## 2017-10-24 NOTE — Telephone Encounter (Signed)
[facility] Pre-operative Risk Assessment     Request for surgical clearance:     Endoscopy Procedure  What type of surgery is being performed?     Colonoscopy  When is this surgery scheduled?     11/13/17  What type of clearance is required ?   Pharmacy  Are there any medications that need to be held prior to surgery and how long? HOLD Xarelto for 2 days prior  Practice name and name of physician performing surgery?      [facility]/ [place] What is your office phone and fax number?      Phone- [phone]  [phone]  Anesthesia type (None, local, MAC, general) ?       MAC

## 2017-10-25 NOTE — Progress Notes (Signed)
Thank you for sending this case to me. I have reviewed the entire note, and the outlined plan seems appropriate.  Check with cardiology/anti coag clinic per ussual, but he can most likely take last dose of [place] 24 hrs before procedure since he had normal renal function in Feb 2019.  Wilfrid Lund, MD

## 2017-10-28 NOTE — Telephone Encounter (Signed)
See note from Pharm. On Xarelto.

## 2017-10-28 NOTE — Telephone Encounter (Signed)
Please make sure this patient has instructions on Xarelto well in advance of his colonoscopy.  Recommendations were given by cardiology and pharmacy and included with the encounter messages.

## 2017-10-28 NOTE — Telephone Encounter (Signed)
lvm for pt to call office to discuss holding xarelto prior to pt's colonoscopy.  Will route via Epic Dr. Loletha Carrow at [place].

## 2017-10-29 NOTE — Telephone Encounter (Signed)
Spoke with patient this morning.  Patient advised to HOLD Xarelto one day prior to colonoscopy.  Patient verbalized understanding.

## 2017-11-13 ENCOUNTER — Ambulatory Visit (AMBULATORY_SURGERY_CENTER): Payer: BLUE CROSS/BLUE SHIELD | Admitting: Gastroenterology

## 2017-11-13 ENCOUNTER — Encounter: Payer: Self-pay | Admitting: Gastroenterology

## 2017-11-13 ENCOUNTER — Other Ambulatory Visit: Payer: Self-pay

## 2017-11-13 VITALS — BP 102/64 | HR 65 | Temp 97.3°F | Resp 17 | Ht 70.0 in | Wt 239.0 lb

## 2017-11-13 DIAGNOSIS — D122 Benign neoplasm of ascending colon: Secondary | ICD-10-CM | POA: Diagnosis not present

## 2017-11-13 DIAGNOSIS — Z1211 Encounter for screening for malignant neoplasm of colon: Secondary | ICD-10-CM | POA: Diagnosis not present

## 2017-11-13 DIAGNOSIS — K635 Polyp of colon: Secondary | ICD-10-CM | POA: Diagnosis not present

## 2017-11-13 MED ORDER — SODIUM CHLORIDE 0.9 % IV SOLN: 500.0000 mL | Freq: Once | INTRAVENOUS | Status: AC

## 2017-11-13 NOTE — Progress Notes (Signed)
Called to room to assist during endoscopic procedure.  Patient ID and intended procedure confirmed with present staff. Received instructions for my participation in the procedure from the performing physician.  

## 2017-11-13 NOTE — Progress Notes (Signed)
A and O x3. Report to RN. Tolerated MAC anesthesia well.

## 2017-11-13 NOTE — Patient Instructions (Signed)
Resume Xarelto in 2 days!  YOU HAD AN ENDOSCOPIC PROCEDURE TODAY AT [facility]:   Refer to the procedure report that was given to you for any specific questions about what was found during the examination.  If the procedure report does not answer your questions, please call your gastroenterologist to clarify.  If you requested that your care partner not be given the details of your procedure findings, then the procedure report has been included in a sealed envelope for you to review at your convenience later.  YOU SHOULD EXPECT: Some feelings of bloating in the abdomen. Passage of more gas than usual.  Walking can help get rid of the air that was put into your GI tract during the procedure and reduce the bloating. If you had a lower endoscopy (such as a colonoscopy or flexible sigmoidoscopy) you may notice spotting of blood in your stool or on the toilet paper. If you underwent a bowel prep for your procedure, you may not have a normal bowel movement for a few days.  Please Note:  You might notice some irritation and congestion in your nose or some drainage.  This is from the oxygen used during your procedure.  There is no need for concern and it should clear up in a day or so.  SYMPTOMS TO REPORT IMMEDIATELY:   Following lower endoscopy (colonoscopy or flexible sigmoidoscopy):  Excessive amounts of blood in the stool  Significant tenderness or worsening of abdominal pains  Swelling of the abdomen that is new, acute  Fever of 100F or higher   For urgent or emergent issues, a gastroenterologist can be reached at any hour by calling [phone].   DIET:  We do recommend a small meal at first, but then you may proceed to your regular diet.  Drink plenty of fluids but you should avoid alcoholic beverages for 24 hours.  ACTIVITY:  You should plan to take it easy for the rest of today and you should NOT DRIVE or use heavy machinery until tomorrow (because of the sedation  medicines used during the test).    FOLLOW UP: Our staff will call the number listed on your records the next business day following your procedure to check on you and address any questions or concerns that you may have regarding the information given to you following your procedure. If we do not reach you, we will leave a message.  However, if you are feeling well and you are not experiencing any problems, there is no need to return our call.  We will assume that you have returned to your regular daily activities without incident.  If any biopsies were taken you will be contacted by phone or by letter within the next 1-3 weeks.  Please call us at [phone] if you have not heard about the biopsies in 3 weeks.    SIGNATURES/CONFIDENTIALITY: You and/or your care partner have signed paperwork which will be entered into your electronic medical record.  These signatures attest to the fact that that the information above on your After Visit Summary has been reviewed and is understood.  Full responsibility of the confidentiality of this discharge information lies with you and/or your care-partner.

## 2017-11-13 NOTE — Op Note (Signed)
[place] Patient Name: Gregory Gregory Procedure Date: 11/13/2017 7:57 AM MRN: [id number] Endoscopist: Mallie Mussel L. Loletha Carrow , MD Age: 62 Referring MD:  Date of Birth: January 25, 1956 Gender: Male Account #: [id number] Procedure:                Colonoscopy Indications:              Screening for colorectal malignant neoplasm, This                            is the patient's first colonoscopy Medicines:                Monitored Anesthesia Care Procedure:                Pre-Anesthesia Assessment:                           - Prior to the procedure, a History and Physical                            was performed, and patient medications and                            allergies were reviewed. The patient's tolerance of                            previous anesthesia was also reviewed. The risks                            and benefits of the procedure and the sedation                            options and risks were discussed with the patient.                            All questions were answered, and informed consent                            was obtained. Prior Anticoagulants: The patient has                            taken Xarelto (rivaroxaban), last dose was 1 day                            prior to procedure. ASA Grade Assessment: II - A                            patient with mild systemic disease. After reviewing                            the risks and benefits, the patient was deemed in                            satisfactory condition to undergo the procedure.  After obtaining informed consent, the colonoscope                            was passed under direct vision. Throughout the                            procedure, the patient's blood pressure, pulse, and                            oxygen saturations were monitored continuously. The                            Colonoscope was introduced through the anus and                            advanced to the  the cecum, identified by                            appendiceal orifice and ileocecal valve. The                            colonoscopy was performed without difficulty. The                            patient tolerated the procedure well. The quality                            of the bowel preparation was good. The ileocecal                            valve, appendiceal orifice, and rectum were                            photographed. The quality of the bowel preparation                            was evaluated using the BBPS [facility] Bowel                            Preparation Scale) with scores of: Right Colon = 2,                            Transverse Colon = 2 and Left Colon = 2. The total                            BBPS score equals 6. The bowel preparation used was                            SUPREP. Scope In: 8:05:50 AM Scope Out: 8:29:30 AM Scope Withdrawal Time: 0 hours 18 minutes 41 seconds  Total Procedure Duration: 0 hours 23 minutes 40 seconds  Findings:                 The perianal and digital rectal  examinations were                            normal.                           A few diverticula were found in the right colon.                           A 12 mm polyp was found in the proximal ascending                            colon. The polyp was sessile with a mucus cap. The                            polyp was removed with a piecemeal technique using                            a cold snare and cold biopsy forceps. Resection and                            retrieval were complete.                           The exam was otherwise without abnormality on                            direct and retroflexion views. Complications:            No immediate complications. Estimated Blood Loss:     Estimated blood loss was minimal. Impression:               - Diverticulosis in the right colon.                           - One 12 mm polyp in the proximal ascending colon,                             removed piecemeal using a cold snare. Resected and                            retrieved.                           - The examination was otherwise normal on direct                            and retroflexion views. Recommendation:           - Patient has a contact number available for                            emergencies. The signs and symptoms of potential                            delayed complications were discussed with  the                            patient. Return to normal activities tomorrow.                            Written discharge instructions were provided to the                            patient.                           - Resume previous diet.                           - Continue present medications.                           - Resume Xarelto (rivaroxaban) at prior dose in 2                            days.                           - Await pathology results.                           - Repeat colonoscopy is recommended for                            surveillance. The colonoscopy date will be                            determined after pathology results from today's                            exam become available for review. Gregory Gregory L. Loletha Carrow, MD 11/13/2017 8:34:09 AM This report has been signed electronically.

## 2017-11-15 ENCOUNTER — Telehealth: Payer: Self-pay | Admitting: *Deleted

## 2017-11-15 DIAGNOSIS — E1169 Type 2 diabetes mellitus with other specified complication: Secondary | ICD-10-CM

## 2017-11-15 DIAGNOSIS — Z9861 Coronary angioplasty status: Principal | ICD-10-CM

## 2017-11-15 DIAGNOSIS — I2510 Atherosclerotic heart disease of native coronary artery without angina pectoris: Secondary | ICD-10-CM

## 2017-11-15 DIAGNOSIS — E785 Hyperlipidemia, unspecified: Secondary | ICD-10-CM

## 2017-11-15 NOTE — Telephone Encounter (Signed)
[place] [facility] SLIP MAILED

## 2017-11-15 NOTE — Telephone Encounter (Signed)
-----   Message from Raiford Simmonds, RN sent at 06/19/2017  1:31 PM EST ----- NEED LIPIDS, CMP  WILL NEED TO DO A LABSLIP AND MAIL 11/15/17 DUE 12/16/17

## 2017-11-18 ENCOUNTER — Telehealth: Payer: Self-pay

## 2017-11-18 NOTE — Telephone Encounter (Signed)
Left message

## 2017-11-18 NOTE — Telephone Encounter (Signed)
  Follow up Call-  Call back number 11/13/2017  Post procedure Call Back phone  # [phone]  Permission to leave phone message Yes  Some recent data might be hidden     Patient questions:  Do you have a fever, pain , or abdominal swelling? No. Pain Score  0 *  Have you tolerated food without any problems? Yes.    Have you been able to return to your normal activities? Yes.    Do you have any questions about your discharge instructions: Diet   No. Medications  No. Follow up visit  No.  Do you have questions or concerns about your Care? No.  Actions: * If pain score is 4 or above: No action needed, pain <4.

## 2017-11-22 ENCOUNTER — Encounter: Payer: Self-pay | Admitting: Gastroenterology

## 2017-12-12 LAB — COMPREHENSIVE METABOLIC PANEL
ALBUMIN: 4.5 g/dL (ref 3.6–4.8)
ALT: 22 IU/L (ref 0–44)
AST: 15 IU/L (ref 0–40)
Albumin/Globulin Ratio: 1.7 (ref 1.2–2.2)
Alkaline Phosphatase: 42 IU/L (ref 39–117)
BUN / CREAT RATIO: 21 (ref 10–24)
BUN: 21 mg/dL (ref 8–27)
Bilirubin Total: 0.2 mg/dL (ref 0.0–1.2)
CO2: 23 mmol/L (ref 20–29)
Calcium: 9.6 mg/dL (ref 8.6–10.2)
Chloride: 102 mmol/L (ref 96–106)
Creatinine, Ser: 1 mg/dL (ref 0.76–1.27)
GFR calc Af Amer: 94 mL/min/{1.73_m2} (ref >59)
GFR calc non Af Amer: 81 mL/min/{1.73_m2} (ref >59)
GLOBULIN, TOTAL: 2.6 g/dL (ref 1.5–4.5)
Glucose: 108 mg/dL — ABNORMAL HIGH (ref 65–99)
POTASSIUM: 4.4 mmol/L (ref 3.5–5.2)
SODIUM: 139 mmol/L (ref 134–144)
Total Protein: 7.1 g/dL (ref 6.0–8.5)

## 2017-12-12 LAB — LIPID PANEL
CHOLESTEROL TOTAL: 125 mg/dL (ref 100–199)
Chol/HDL Ratio: 2.8 ratio (ref 0.0–5.0)
HDL: 45 mg/dL (ref >39)
LDL Calculated: 69 mg/dL (ref 0–99)
TRIGLYCERIDES: 54 mg/dL (ref 0–149)
VLDL Cholesterol Cal: 11 mg/dL (ref 5–40)

## 2017-12-31 ENCOUNTER — Other Ambulatory Visit: Payer: Self-pay | Admitting: Cardiology

## 2018-02-14 ENCOUNTER — Other Ambulatory Visit: Payer: Self-pay | Admitting: Cardiology

## 2018-03-19 ENCOUNTER — Other Ambulatory Visit: Payer: Self-pay | Admitting: Physician Assistant

## 2018-03-19 ENCOUNTER — Other Ambulatory Visit: Payer: Self-pay | Admitting: Cardiology

## 2018-03-19 NOTE — Telephone Encounter (Signed)
Rx request sent to pharmacy.  

## 2018-03-19 NOTE — Telephone Encounter (Signed)
This is Dr. Ellyn Hack pt.

## 2018-03-21 NOTE — Telephone Encounter (Signed)
Rx request sent to pharmacy.  

## 2018-05-20 ENCOUNTER — Other Ambulatory Visit: Payer: Self-pay | Admitting: Cardiology

## 2018-06-06 ENCOUNTER — Other Ambulatory Visit: Payer: Self-pay | Admitting: Cardiology

## 2018-06-23 ENCOUNTER — Telehealth: Payer: Self-pay | Admitting: *Deleted

## 2018-06-23 DIAGNOSIS — Z9861 Coronary angioplasty status: Principal | ICD-10-CM

## 2018-06-23 DIAGNOSIS — E1169 Type 2 diabetes mellitus with other specified complication: Secondary | ICD-10-CM

## 2018-06-23 DIAGNOSIS — E785 Hyperlipidemia, unspecified: Secondary | ICD-10-CM

## 2018-06-23 DIAGNOSIS — I2510 Atherosclerotic heart disease of native coronary artery without angina pectoris: Secondary | ICD-10-CM

## 2018-06-23 NOTE — Telephone Encounter (Signed)
MAILED LETTER AND LABSLIP 

## 2018-06-23 NOTE — Telephone Encounter (Signed)
-----   Message from Raiford Simmonds, RN sent at 07/23/2017  8:35 AM EDT ----- LABS DUE 07/24/2018--LIPID ,CMP MAIL@2 /04/2019

## 2018-07-11 ENCOUNTER — Other Ambulatory Visit: Payer: Self-pay | Admitting: Cardiology

## 2018-07-21 LAB — COMPREHENSIVE METABOLIC PANEL
ALT: 39 IU/L (ref 0–44)
AST: 28 IU/L (ref 0–40)
Albumin/Globulin Ratio: 1.7 (ref 1.2–2.2)
Albumin: 4.6 g/dL (ref 3.8–4.8)
Alkaline Phosphatase: 46 IU/L (ref 39–117)
BILIRUBIN TOTAL: 0.3 mg/dL (ref 0.0–1.2)
BUN/Creatinine Ratio: 19 (ref 10–24)
BUN: 22 mg/dL (ref 8–27)
CHLORIDE: 100 mmol/L (ref 96–106)
CO2: 22 mmol/L (ref 20–29)
Calcium: 9.6 mg/dL (ref 8.6–10.2)
Creatinine, Ser: 1.14 mg/dL (ref 0.76–1.27)
GFR calc Af Amer: 80 mL/min/{1.73_m2} (ref >59)
GFR calc non Af Amer: 69 mL/min/{1.73_m2} (ref >59)
GLUCOSE: 103 mg/dL — AB (ref 65–99)
Globulin, Total: 2.7 g/dL (ref 1.5–4.5)
Potassium: 4.4 mmol/L (ref 3.5–5.2)
Sodium: 139 mmol/L (ref 134–144)
Total Protein: 7.3 g/dL (ref 6.0–8.5)

## 2018-07-21 LAB — LIPID PANEL
CHOL/HDL RATIO: 3.3 ratio (ref 0.0–5.0)
Cholesterol, Total: 156 mg/dL (ref 100–199)
HDL: 47 mg/dL (ref >39)
LDL Calculated: 85 mg/dL (ref 0–99)
Triglycerides: 118 mg/dL (ref 0–149)
VLDL CHOLESTEROL CAL: 24 mg/dL (ref 5–40)

## 2018-07-30 ENCOUNTER — Telehealth: Payer: Self-pay | Admitting: *Deleted

## 2018-07-30 NOTE — Telephone Encounter (Signed)
LEFT MESSAGE TO RESCHEDULE APPT FOR 08/05/18 IF POSSIBLE COVID PROTOCOL

## 2018-07-31 NOTE — Telephone Encounter (Signed)
Patient called back  - rescheduled for 4 /2020

## 2018-08-05 ENCOUNTER — Ambulatory Visit: Payer: BLUE CROSS/BLUE SHIELD | Admitting: Cardiology

## 2018-08-15 ENCOUNTER — Telehealth: Payer: Self-pay | Admitting: *Deleted

## 2018-08-15 DIAGNOSIS — E785 Hyperlipidemia, unspecified: Secondary | ICD-10-CM

## 2018-08-15 DIAGNOSIS — Z79899 Other long term (current) drug therapy: Secondary | ICD-10-CM

## 2018-08-15 DIAGNOSIS — E1169 Type 2 diabetes mellitus with other specified complication: Secondary | ICD-10-CM

## 2018-08-15 MED ORDER — ROSUVASTATIN CALCIUM 40 MG PO TABS: 40.0000 mg | ORAL_TABLET | Freq: Every day | ORAL | 2 refills | Status: DC

## 2018-08-15 NOTE — Telephone Encounter (Signed)
-----   Message from Leonie Man, MD sent at 07/30/2018  4:26 PM EDT ----- Unfortunately, cholesterol levels went up from 7 months & 1 yr.  Would like to go up to 40 mg rosuvastatin.  Recheck in 6 months.  [name], MD  Rx change: Stop rosuvastatin 20 mg & start Rosuvastatin 40 mg (can use 2 20mg  tabs until done). rosuvastatin 40 mg po daily.  Disp: #90.  3 refills. [name], MD

## 2018-08-15 NOTE — Telephone Encounter (Signed)
Left message will be sending new prescription    ordered placed for 6 month cmp ,lipid  routed to  Primary results

## 2018-08-21 ENCOUNTER — Other Ambulatory Visit: Payer: Self-pay | Admitting: Cardiology

## 2018-08-21 NOTE — Telephone Encounter (Signed)
Lopressor  Refilled.

## 2018-08-28 ENCOUNTER — Telehealth: Payer: Self-pay | Admitting: *Deleted

## 2018-08-28 NOTE — Telephone Encounter (Signed)
Left message to call back -  Need to schedule televisit w/harding 4/21

## 2018-09-02 ENCOUNTER — Ambulatory Visit: Payer: BLUE CROSS/BLUE SHIELD | Admitting: Cardiology

## 2018-09-18 ENCOUNTER — Other Ambulatory Visit: Payer: Self-pay | Admitting: Cardiology

## 2018-09-30 ENCOUNTER — Other Ambulatory Visit: Payer: Self-pay | Admitting: Cardiology

## 2018-09-30 NOTE — Telephone Encounter (Signed)
63yo Male 108.4kg Scr = 1.14 on 07/21/2018 OV 07/24/2017 (1 yr f/u cancelled d/y COVID-19) due to re-schedule

## 2018-10-14 ENCOUNTER — Other Ambulatory Visit: Payer: Self-pay | Admitting: Cardiology

## 2018-10-23 ENCOUNTER — Telehealth: Payer: Self-pay | Admitting: Cardiology

## 2018-10-23 NOTE — Telephone Encounter (Signed)
Left message for patient.  He has had two cancelled appts with Dr. Ellyn Hack.  He needs to reschedule - televisit is fine.

## 2018-11-04 ENCOUNTER — Other Ambulatory Visit: Payer: Self-pay | Admitting: Cardiology

## 2018-11-05 ENCOUNTER — Telehealth: Payer: Self-pay | Admitting: Cardiology

## 2018-11-05 NOTE — Telephone Encounter (Signed)
Left message to call back. Please schedule follow up appt with Dr. Ellyn Hack.  Previous appts were cancelled due to COVID.  He can come in on a DOD day or a day Dr. Ellyn Hack Is here all day.

## 2018-11-17 ENCOUNTER — Other Ambulatory Visit: Payer: Self-pay | Admitting: Cardiology

## 2018-11-17 NOTE — Telephone Encounter (Signed)
Rx(s) sent to pharmacy electronically.  

## 2018-12-16 ENCOUNTER — Other Ambulatory Visit: Payer: Self-pay | Admitting: Cardiology

## 2019-01-02 ENCOUNTER — Other Ambulatory Visit: Payer: Self-pay | Admitting: Cardiology

## 2019-01-02 NOTE — Telephone Encounter (Signed)
62 M 108.4 kg, SCr 1.14 (07/2018), LOV 07/2017 DH

## 2019-01-09 ENCOUNTER — Other Ambulatory Visit: Payer: Self-pay | Admitting: Cardiology

## 2019-01-28 ENCOUNTER — Other Ambulatory Visit: Payer: Self-pay | Admitting: Cardiology

## 2019-01-29 NOTE — Telephone Encounter (Signed)
10m Scr 1.14 07/21/18 111.1kg Lovw/harding 07/23/17 ccr 105.6 ml/min

## 2019-02-17 ENCOUNTER — Other Ambulatory Visit: Payer: Self-pay | Admitting: Cardiology

## 2019-03-06 ENCOUNTER — Other Ambulatory Visit: Payer: Self-pay | Admitting: Cardiology

## 2019-03-31 ENCOUNTER — Ambulatory Visit: Payer: BLUE CROSS/BLUE SHIELD | Admitting: Cardiology

## 2019-04-04 ENCOUNTER — Other Ambulatory Visit: Payer: Self-pay | Admitting: Cardiology

## 2019-04-14 ENCOUNTER — Ambulatory Visit: Payer: BC Managed Care – PPO | Admitting: Cardiology

## 2019-04-14 ENCOUNTER — Other Ambulatory Visit: Payer: Self-pay

## 2019-04-14 ENCOUNTER — Encounter: Payer: Self-pay | Admitting: Cardiology

## 2019-04-14 VITALS — BP 142/70 | HR 51 | Temp 97.3°F | Ht 70.0 in | Wt 246.6 lb

## 2019-04-14 DIAGNOSIS — I82411 Acute embolism and thrombosis of right femoral vein: Secondary | ICD-10-CM

## 2019-04-14 DIAGNOSIS — E1169 Type 2 diabetes mellitus with other specified complication: Secondary | ICD-10-CM

## 2019-04-14 DIAGNOSIS — I2510 Atherosclerotic heart disease of native coronary artery without angina pectoris: Secondary | ICD-10-CM | POA: Diagnosis not present

## 2019-04-14 DIAGNOSIS — I10 Essential (primary) hypertension: Secondary | ICD-10-CM

## 2019-04-14 DIAGNOSIS — E785 Hyperlipidemia, unspecified: Secondary | ICD-10-CM

## 2019-04-14 DIAGNOSIS — I214 Non-ST elevation (NSTEMI) myocardial infarction: Secondary | ICD-10-CM | POA: Diagnosis not present

## 2019-04-14 DIAGNOSIS — Z9861 Coronary angioplasty status: Secondary | ICD-10-CM | POA: Diagnosis not present

## 2019-04-14 DIAGNOSIS — R600 Localized edema: Secondary | ICD-10-CM

## 2019-04-14 NOTE — Patient Instructions (Signed)
Medication Instructions:  NO CHANGES *If you need a refill on your cardiac medications before your next appointment, please call your pharmacy*  Lab Work: NOT NEEDED  Testing/Procedures: NOT NEEDED  Follow-Up: At [facility], you and your health needs are our priority.  As part of our continuing mission to provide you with exceptional heart care, we have created designated Provider Care Teams.  These Care Teams include your primary Cardiologist (physician) and Advanced Practice Providers (APPs -  Physician Assistants and Nurse Practitioners) who all work together to provide you with the care you need, when you need it.  Your next appointment:   12 month(s)- DEC 2021  The format for your next appointment:   In Person  Provider:   [name], MD  Other Instructions
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
## 2019-04-15 ENCOUNTER — Encounter: Payer: Self-pay | Admitting: Cardiology

## 2019-04-15 NOTE — Assessment & Plan Note (Signed)
Really more on the right leg than the left.  Probably related to his extensive DVT with some deep venous insufficiency. Continue support stockings with as needed Lasix.

## 2019-04-15 NOTE — Assessment & Plan Note (Signed)
Now chronic DVT related phlebitis swelling.  Probably has some deep venous insufficiency now.  On lifelong Xarelto because of extensive thrombus.  Usually well controlled when wearing his right-sided compression stocking. Has as needed Lasix that he takes occasionally for his swelling.  Usually gets better when he puts his leg up.

## 2019-04-15 NOTE — Assessment & Plan Note (Signed)
Multivessel CAD treated with multivessel PCI I believe CABG.  Since his MI, he is lost a ton of weight and is on minimal blood pressure medications.  Lipids well controlled on current dose of statin.  Negative Myoview in May 2017 for atypical symptoms.  Not on aspirin or statin Plavix because of Xarelto.  On stable dose of beta-blocker and ACE inhibitor.

## 2019-04-15 NOTE — Assessment & Plan Note (Signed)
Blood pressure is actually a little high for him today.  Usually at home much 120/70 range.  Disorder was her last saw PCP. He did rush to come in this morning. Continue to monitor.  Otherwise continue current dose of Lopressor and lisinopril.

## 2019-04-15 NOTE — Assessment & Plan Note (Signed)
Distant history of MI.  Mildly reduced EF with no signs of heart failure recurrent angina.  On ACE inhibitor, beta-blocker and low-dose Lasix as needed.

## 2019-04-15 NOTE — Assessment & Plan Note (Addendum)
Currently now on Lovaza and rosuvastatin.  I do not have his recent labs from PCP.  Most recent 1 to have showed LDL went up to 85.  Would like LDL to be as low as 55, but acceptable less than 70.  Depending on what labs look like from his PCP, may need to consider more aggressive management.  Could consider switching from Lovaza to Vascepa and potentially consider adding Zetia.  Had previously been pretty well controlled.  If not able to reach target, may need to consider PCSK9 inhibitor.  Does not do very well with higher doses of Metformin.  Can consider SGLT2 inhibitor for cardiovascular benefit as well as to potentially reduce diuretic requirement.

## 2019-05-01 ENCOUNTER — Other Ambulatory Visit: Payer: Self-pay | Admitting: Cardiology

## 2019-05-01 NOTE — Telephone Encounter (Signed)
63 M 111.9 kg, SCr 1.14 (07/2018), LOV  harding 04/2019   CrCl 106.3

## 2019-05-25 ENCOUNTER — Other Ambulatory Visit: Payer: Self-pay | Admitting: Cardiology

## 2019-06-30 ENCOUNTER — Other Ambulatory Visit: Payer: Self-pay | Admitting: Cardiology

## 2019-09-05 ENCOUNTER — Other Ambulatory Visit: Payer: Self-pay | Admitting: Cardiology

## 2019-11-04 ENCOUNTER — Other Ambulatory Visit: Payer: Self-pay | Admitting: Cardiology

## 2020-02-09 ENCOUNTER — Other Ambulatory Visit: Payer: Self-pay | Admitting: Cardiology

## 2020-04-13 ENCOUNTER — Ambulatory Visit: Payer: BC Managed Care – PPO | Admitting: Cardiology

## 2020-05-05 ENCOUNTER — Other Ambulatory Visit: Payer: Self-pay

## 2020-05-05 ENCOUNTER — Encounter: Payer: Self-pay | Admitting: Cardiology

## 2020-05-05 ENCOUNTER — Ambulatory Visit (INDEPENDENT_AMBULATORY_CARE_PROVIDER_SITE_OTHER): Payer: BC Managed Care – PPO | Admitting: Cardiology

## 2020-05-05 VITALS — BP 132/72 | HR 65 | Ht 71.0 in | Wt 232.0 lb

## 2020-05-05 DIAGNOSIS — I2510 Atherosclerotic heart disease of native coronary artery without angina pectoris: Secondary | ICD-10-CM

## 2020-05-05 DIAGNOSIS — Z9861 Coronary angioplasty status: Secondary | ICD-10-CM | POA: Diagnosis not present

## 2020-05-05 DIAGNOSIS — R600 Localized edema: Secondary | ICD-10-CM

## 2020-05-05 DIAGNOSIS — E785 Hyperlipidemia, unspecified: Secondary | ICD-10-CM

## 2020-05-05 DIAGNOSIS — I214 Non-ST elevation (NSTEMI) myocardial infarction: Secondary | ICD-10-CM | POA: Diagnosis not present

## 2020-05-05 DIAGNOSIS — E1169 Type 2 diabetes mellitus with other specified complication: Secondary | ICD-10-CM

## 2020-05-05 DIAGNOSIS — Z7901 Long term (current) use of anticoagulants: Secondary | ICD-10-CM

## 2020-05-05 DIAGNOSIS — I10 Essential (primary) hypertension: Secondary | ICD-10-CM | POA: Diagnosis not present

## 2020-05-05 DIAGNOSIS — I82411 Acute embolism and thrombosis of right femoral vein: Secondary | ICD-10-CM

## 2020-05-05 NOTE — Patient Instructions (Addendum)
Medication Instructions:  No changes  If you have increase cramping  You may try a statin holiday -  meaning -- Stop taking medication ( rosuvastatin )  for 2 weeks to see if this helps)  *If you need a refill on your cardiac medications before your next appointment, please call your pharmacy*   Lab Work: Not needed   Testing/Procedures: Not needed  Follow-Up: At [facility], you and your health needs are our priority.  As part of our continuing mission to provide you with exceptional heart care, we have created designated Provider Care Teams.  These Care Teams include your primary Cardiologist (physician) and Advanced Practice Providers (APPs -  Physician Assistants and Nurse Practitioners) who all work together to provide you with the care you need, when you need it.     Your next appointment:        12  month(s)  The format for your next appointment:   In Person  Provider:   [name], MD   Other Instructions
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
## 2020-05-15 ENCOUNTER — Encounter: Payer: Self-pay | Admitting: Cardiology

## 2020-05-15 NOTE — Assessment & Plan Note (Signed)
Multivessel CAD treated multivessel PCI.  No recurrent angina.  Plan: Continue current dose of Lopressor and lisinopril  On stable dose of rosuvastatin plus Lovaza  He has as needed Lasix, but not using  Not on aspirin/Plavix because of Xarelto.  In the absence of symptoms, probably would not opt for stress test evaluation.  He is very active and has not had any symptoms.

## 2020-05-15 NOTE — Assessment & Plan Note (Signed)
Multivessel PCI.  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

## 2020-05-15 NOTE — Assessment & Plan Note (Signed)
9.5 years out from his initial MI with multivessel PCI. He is done remarkably well since then.  Has not had any angina since the PCI's.  He has been outstanding with lifestyle modification, weight loss, exercise.  Lipid control has been good.  Diabetes control is improved.  Normal EF.  No angina or heart failure.

## 2020-05-15 NOTE — Assessment & Plan Note (Signed)
Has come all the way down to BMI to barely over 30.  Continues to work on his diet and exercise.  I congratulated his efforts.  Encouraged him to continue going forward.

## 2020-05-15 NOTE — Assessment & Plan Note (Signed)
Blood pressure a little on the high side for him today.  He is on low-dose of lisinopril and metoprolol.  Doing well.  No changes.  Monitor

## 2020-05-15 NOTE — Assessment & Plan Note (Signed)
Remains on Xarelto.  Stable.  No major issues.  Okay to hold for procedures per protocol.

## 2020-05-15 NOTE — Assessment & Plan Note (Addendum)
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  For neuro procedures, 72 hours.  Keep wearing support stockings, especially when on his feet

## 2020-05-15 NOTE — Assessment & Plan Note (Signed)
R>L related to venous stasis.  Recommend foot elevation and support hose.  Focus mostly on right leg, but protect left leg as well.  He says that he has not had much in the way of any problem with swelling since he stopped working and was on his feet all day long.

## 2020-05-30 ENCOUNTER — Other Ambulatory Visit: Payer: Self-pay | Admitting: Cardiology

## 2020-08-24 ENCOUNTER — Other Ambulatory Visit: Payer: Self-pay | Admitting: Cardiology

## 2020-11-20 ENCOUNTER — Other Ambulatory Visit: Payer: Self-pay | Admitting: Cardiology

## 2020-11-21 NOTE — Telephone Encounter (Signed)
28m, 105.2kg, Creatinine, Serum 1.100 MG/ 08/29/2020, lovw/ harding 05/05/20, ccr 102.3

## 2020-12-21 ENCOUNTER — Encounter: Payer: Self-pay | Admitting: Gastroenterology

## 2021-03-15 DIAGNOSIS — R7301 Impaired fasting glucose: Secondary | ICD-10-CM | POA: Diagnosis not present

## 2021-03-15 DIAGNOSIS — Z125 Encounter for screening for malignant neoplasm of prostate: Secondary | ICD-10-CM | POA: Diagnosis not present

## 2021-03-15 DIAGNOSIS — Z0000 Encounter for general adult medical examination without abnormal findings: Secondary | ICD-10-CM | POA: Diagnosis not present

## 2021-03-23 DIAGNOSIS — N182 Chronic kidney disease, stage 2 (mild): Secondary | ICD-10-CM | POA: Diagnosis not present

## 2021-03-23 DIAGNOSIS — I10 Essential (primary) hypertension: Secondary | ICD-10-CM | POA: Diagnosis not present

## 2021-03-23 DIAGNOSIS — E1169 Type 2 diabetes mellitus with other specified complication: Secondary | ICD-10-CM | POA: Diagnosis not present

## 2021-03-23 DIAGNOSIS — Z0000 Encounter for general adult medical examination without abnormal findings: Secondary | ICD-10-CM | POA: Diagnosis not present

## 2021-05-19 ENCOUNTER — Other Ambulatory Visit: Payer: Self-pay | Admitting: Cardiology

## 2021-06-28 ENCOUNTER — Other Ambulatory Visit: Payer: Self-pay | Admitting: Cardiology

## 2021-06-29 DIAGNOSIS — Z125 Encounter for screening for malignant neoplasm of prostate: Secondary | ICD-10-CM | POA: Diagnosis not present

## 2021-06-29 DIAGNOSIS — E119 Type 2 diabetes mellitus without complications: Secondary | ICD-10-CM | POA: Diagnosis not present

## 2021-06-29 DIAGNOSIS — I10 Essential (primary) hypertension: Secondary | ICD-10-CM | POA: Diagnosis not present

## 2021-06-29 DIAGNOSIS — E7800 Pure hypercholesterolemia, unspecified: Secondary | ICD-10-CM | POA: Diagnosis not present

## 2021-07-06 DIAGNOSIS — E1169 Type 2 diabetes mellitus with other specified complication: Secondary | ICD-10-CM | POA: Diagnosis not present

## 2021-07-06 DIAGNOSIS — E7800 Pure hypercholesterolemia, unspecified: Secondary | ICD-10-CM | POA: Diagnosis not present

## 2021-07-06 DIAGNOSIS — I10 Essential (primary) hypertension: Secondary | ICD-10-CM | POA: Diagnosis not present

## 2021-07-06 DIAGNOSIS — I252 Old myocardial infarction: Secondary | ICD-10-CM | POA: Diagnosis not present

## 2021-07-06 DIAGNOSIS — Z86711 Personal history of pulmonary embolism: Secondary | ICD-10-CM | POA: Diagnosis not present

## 2021-07-06 DIAGNOSIS — Z86718 Personal history of other venous thrombosis and embolism: Secondary | ICD-10-CM | POA: Diagnosis not present

## 2021-07-12 DIAGNOSIS — Z86711 Personal history of pulmonary embolism: Secondary | ICD-10-CM | POA: Diagnosis not present

## 2021-07-12 DIAGNOSIS — Z86718 Personal history of other venous thrombosis and embolism: Secondary | ICD-10-CM | POA: Diagnosis not present

## 2021-07-12 DIAGNOSIS — E139 Other specified diabetes mellitus without complications: Secondary | ICD-10-CM | POA: Diagnosis not present

## 2021-07-12 DIAGNOSIS — I252 Old myocardial infarction: Secondary | ICD-10-CM | POA: Diagnosis not present

## 2021-08-07 ENCOUNTER — Other Ambulatory Visit: Payer: Self-pay

## 2021-08-07 ENCOUNTER — Encounter: Payer: Self-pay | Admitting: Cardiology

## 2021-08-07 ENCOUNTER — Ambulatory Visit (INDEPENDENT_AMBULATORY_CARE_PROVIDER_SITE_OTHER): Payer: BC Managed Care – PPO | Admitting: Cardiology

## 2021-08-07 VITALS — BP 122/64 | HR 52 | Ht 70.0 in | Wt 257.0 lb

## 2021-08-07 DIAGNOSIS — Z9861 Coronary angioplasty status: Secondary | ICD-10-CM

## 2021-08-07 DIAGNOSIS — I214 Non-ST elevation (NSTEMI) myocardial infarction: Secondary | ICD-10-CM

## 2021-08-07 DIAGNOSIS — R413 Other amnesia: Secondary | ICD-10-CM

## 2021-08-07 DIAGNOSIS — I10 Essential (primary) hypertension: Secondary | ICD-10-CM

## 2021-08-07 DIAGNOSIS — I2510 Atherosclerotic heart disease of native coronary artery without angina pectoris: Secondary | ICD-10-CM

## 2021-08-07 DIAGNOSIS — Z7901 Long term (current) use of anticoagulants: Secondary | ICD-10-CM

## 2021-08-07 DIAGNOSIS — E1169 Type 2 diabetes mellitus with other specified complication: Secondary | ICD-10-CM

## 2021-08-07 DIAGNOSIS — E785 Hyperlipidemia, unspecified: Secondary | ICD-10-CM
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
## 2021-08-07 NOTE — Patient Instructions (Signed)

## 2021-09-13 ENCOUNTER — Encounter: Payer: Self-pay | Admitting: Cardiology

## 2021-09-13 NOTE — Assessment & Plan Note (Signed)
Unfortunately he had lost all of his 230 pounds and is now back to 257 pounds.  Hopefully, now that his energy level is better with better glycemic control, he will be able to get back into doing exercise more routinely.  He also knows that he needs to adjust his diet and get back on the regimen that he had been on before.Gregory Gregory ?

## 2021-09-13 NOTE — Assessment & Plan Note (Signed)
Stable blood pressure on low-dose Lopressor and moderate dose lisinopril.  No change. ?

## 2021-09-13 NOTE — Assessment & Plan Note (Signed)
He had a bump up in his LDL to 102 back in April 2022.  Now improved to an LDL of 74 after starting Repatha.  Still not quite at goal, but he is on a combination of Lovaza and Repatha along with high-dose atorvastatin.  Hopefully with continued use of the PCSK9 TAVR, his lipids will continue to improve.  Being followed by PCP. ? ?Unfortunately he did not tolerate Ozempic.  Perhaps may be Darcel Bayley would be another option for GLP-1 agonist.  He was continued on metformin, but they ended Toujeo insulin.  Better glycemic control is definitely made him feel better.  Hopefully he will get back exercising and he can lose back the weight that he gained. ?

## 2021-09-13 NOTE — Assessment & Plan Note (Signed)
This was the major side effect he had with statins.  Interestingly, he is doing fairly well with high-dose rosuvastatin. ?

## 2021-09-13 NOTE — Assessment & Plan Note (Signed)
Almost 11 years since his multivessel PCI in the setting of non-STEMI.  He has come along way since then.  Had lost a significant weight.  He has gained some of that back, but still nowhere near where he was. ? ?Follow-up Myoview in May 2017 showed normal EF with negative perfusion for ischemia or infarction. ? ?Has made dramatic changes to his life which is significant helped his overall outcome. ?

## 2021-09-13 NOTE — Assessment & Plan Note (Signed)
Status post multivessel PCI.  No longer on Plavix.  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 ?

## 2021-09-13 NOTE — Assessment & Plan Note (Signed)
Multivessel disease with multivessel PCI.  No recurrent angina. ? ?Plan: ?? Continue current dose of rosuvastatin plus Lovaza with Repatha. ?? On stable dose of beta-blocker and ACE inhibitor. ?? Intolerant of Ozempic.  With him having edema could consider SGLT2 inhibitor ?? No longer on Plavix or aspirin due to longstanding Xarelto. ?

## 2021-09-13 NOTE — Assessment & Plan Note (Signed)
On Xarelto for recurrent DVTs.  Okay to hold for procedures or surgeries. => High risk procedures-72 hours, low to intermediate risk procedures 48 hours.  Minimal risk procedures 24 hours. ?

## 2021-09-22 ENCOUNTER — Other Ambulatory Visit: Payer: Self-pay | Admitting: Cardiology

## 2021-10-04 DIAGNOSIS — E119 Type 2 diabetes mellitus without complications: Secondary | ICD-10-CM | POA: Diagnosis not present

## 2021-10-04 DIAGNOSIS — I10 Essential (primary) hypertension: Secondary | ICD-10-CM | POA: Diagnosis not present

## 2021-10-04 DIAGNOSIS — E7800 Pure hypercholesterolemia, unspecified: Secondary | ICD-10-CM | POA: Diagnosis not present

## 2021-10-11 DIAGNOSIS — E7800 Pure hypercholesterolemia, unspecified: Secondary | ICD-10-CM | POA: Diagnosis not present

## 2021-10-11 DIAGNOSIS — I252 Old myocardial infarction: Secondary | ICD-10-CM | POA: Diagnosis not present

## 2021-10-11 DIAGNOSIS — I10 Essential (primary) hypertension: Secondary | ICD-10-CM | POA: Diagnosis not present

## 2021-10-11 DIAGNOSIS — E139 Other specified diabetes mellitus without complications: Secondary | ICD-10-CM | POA: Diagnosis not present

## 2021-12-11 ENCOUNTER — Other Ambulatory Visit: Payer: Self-pay | Admitting: Cardiology

## 2021-12-11 DIAGNOSIS — I82411 Acute embolism and thrombosis of right femoral vein: Secondary | ICD-10-CM

## 2021-12-11 NOTE — Telephone Encounter (Signed)
Xarelto '20mg'$  refill request received. Pt is 66 years old, weight-116.6kg, Crea-1.16 on 10/04/2021 via PCP labs from [facility], last seen by Dr. Ellyn Hack on 08/07/2021, Diagnosis-DVT, CrCl-106.40m/min; Dose is appropriate based on dosing criteria.

## 2022-01-01 ENCOUNTER — Other Ambulatory Visit: Payer: Self-pay | Admitting: Cardiology

## 2022-01-04 ENCOUNTER — Other Ambulatory Visit: Payer: Self-pay | Admitting: Cardiology

## 2022-01-04 DIAGNOSIS — E7800 Pure hypercholesterolemia, unspecified: Secondary | ICD-10-CM | POA: Diagnosis not present

## 2022-01-04 DIAGNOSIS — E139 Other specified diabetes mellitus without complications: Secondary | ICD-10-CM | POA: Diagnosis not present

## 2022-01-04 DIAGNOSIS — I10 Essential (primary) hypertension: Secondary | ICD-10-CM | POA: Diagnosis not present

## 2022-01-04 DIAGNOSIS — Z7901 Long term (current) use of anticoagulants: Secondary | ICD-10-CM | POA: Diagnosis not present

## 2022-01-12 DIAGNOSIS — E7800 Pure hypercholesterolemia, unspecified: Secondary | ICD-10-CM | POA: Diagnosis not present

## 2022-01-12 DIAGNOSIS — E139 Other specified diabetes mellitus without complications: Secondary | ICD-10-CM | POA: Diagnosis not present

## 2022-01-12 DIAGNOSIS — I252 Old myocardial infarction: Secondary | ICD-10-CM | POA: Diagnosis not present

## 2022-01-12 DIAGNOSIS — I10 Essential (primary) hypertension: Secondary | ICD-10-CM | POA: Diagnosis not present

## 2022-03-20 DIAGNOSIS — R972 Elevated prostate specific antigen [PSA]: Secondary | ICD-10-CM | POA: Diagnosis not present

## 2022-03-20 DIAGNOSIS — E7800 Pure hypercholesterolemia, unspecified: Secondary | ICD-10-CM | POA: Diagnosis not present

## 2022-03-20 DIAGNOSIS — E1169 Type 2 diabetes mellitus with other specified complication: Secondary | ICD-10-CM | POA: Diagnosis not present

## 2022-03-20 DIAGNOSIS — Z0000 Encounter for general adult medical examination without abnormal findings: Secondary | ICD-10-CM | POA: Diagnosis not present

## 2022-03-20 DIAGNOSIS — I10 Essential (primary) hypertension: Secondary | ICD-10-CM | POA: Diagnosis not present

## 2022-03-20 DIAGNOSIS — N182 Chronic kidney disease, stage 2 (mild): Secondary | ICD-10-CM | POA: Diagnosis not present

## 2022-03-27 DIAGNOSIS — Z0000 Encounter for general adult medical examination without abnormal findings: Secondary | ICD-10-CM | POA: Diagnosis not present

## 2022-03-27 DIAGNOSIS — N182 Chronic kidney disease, stage 2 (mild): Secondary | ICD-10-CM | POA: Diagnosis not present

## 2022-03-27 DIAGNOSIS — E1169 Type 2 diabetes mellitus with other specified complication: Secondary | ICD-10-CM | POA: Diagnosis not present

## 2022-03-27 DIAGNOSIS — I10 Essential (primary) hypertension: Secondary | ICD-10-CM | POA: Diagnosis not present

## 2022-06-19 ENCOUNTER — Other Ambulatory Visit: Payer: Self-pay | Admitting: Cardiology

## 2022-06-19 DIAGNOSIS — I82411 Acute embolism and thrombosis of right femoral vein: Secondary | ICD-10-CM

## 2022-06-19 NOTE — Telephone Encounter (Addendum)
Xarelto '20mg'$  refill request received. Pt is 67 years old, weight-116.6kg, Crea-1.13 on 04/15/2022 from [place] from [facility], last seen by Dr. Ellyn Hack on 08/07/2021, Diagnosis-DVT, CrCl-107.49 mL/min; Will send in refill to requested pharmacy.   Called PCP for updated labs, had to leave a message regarding labs.

## 2022-06-28 DIAGNOSIS — E7800 Pure hypercholesterolemia, unspecified: Secondary | ICD-10-CM | POA: Diagnosis not present

## 2022-06-28 DIAGNOSIS — I10 Essential (primary) hypertension: Secondary | ICD-10-CM | POA: Diagnosis not present

## 2022-06-28 DIAGNOSIS — E139 Other specified diabetes mellitus without complications: Secondary | ICD-10-CM | POA: Diagnosis not present

## 2022-06-28 DIAGNOSIS — I252 Old myocardial infarction: Secondary | ICD-10-CM | POA: Diagnosis not present

## 2022-09-20 DIAGNOSIS — I10 Essential (primary) hypertension: Secondary | ICD-10-CM | POA: Diagnosis not present

## 2022-09-20 DIAGNOSIS — Z7901 Long term (current) use of anticoagulants: Secondary | ICD-10-CM | POA: Diagnosis not present

## 2022-09-20 DIAGNOSIS — E139 Other specified diabetes mellitus without complications: Secondary | ICD-10-CM | POA: Diagnosis not present

## 2022-09-20 DIAGNOSIS — E7800 Pure hypercholesterolemia, unspecified: Secondary | ICD-10-CM | POA: Diagnosis not present

## 2022-09-27 DIAGNOSIS — Z794 Long term (current) use of insulin: Secondary | ICD-10-CM | POA: Diagnosis not present

## 2022-09-27 DIAGNOSIS — I252 Old myocardial infarction: Secondary | ICD-10-CM | POA: Diagnosis not present

## 2022-09-27 DIAGNOSIS — I10 Essential (primary) hypertension: Secondary | ICD-10-CM | POA: Diagnosis not present

## 2022-09-27 DIAGNOSIS — E139 Other specified diabetes mellitus without complications: Secondary | ICD-10-CM | POA: Diagnosis not present

## 2022-09-27 DIAGNOSIS — E7800 Pure hypercholesterolemia, unspecified: Secondary | ICD-10-CM | POA: Diagnosis not present

## 2022-12-17 ENCOUNTER — Other Ambulatory Visit: Payer: Self-pay | Admitting: Cardiology

## 2022-12-17 DIAGNOSIS — I82411 Acute embolism and thrombosis of right femoral vein: Secondary | ICD-10-CM

## 2023-01-21 ENCOUNTER — Other Ambulatory Visit: Payer: Self-pay | Admitting: Cardiology

## 2023-01-21 DIAGNOSIS — I82411 Acute embolism and thrombosis of right femoral vein: Secondary | ICD-10-CM

## 2023-01-22 DIAGNOSIS — E7800 Pure hypercholesterolemia, unspecified: Secondary | ICD-10-CM | POA: Diagnosis not present

## 2023-01-22 DIAGNOSIS — I10 Essential (primary) hypertension: Secondary | ICD-10-CM | POA: Diagnosis not present

## 2023-01-22 DIAGNOSIS — I252 Old myocardial infarction: Secondary | ICD-10-CM | POA: Diagnosis not present

## 2023-01-22 DIAGNOSIS — E139 Other specified diabetes mellitus without complications: Secondary | ICD-10-CM | POA: Diagnosis not present

## 2023-01-22 DIAGNOSIS — Z794 Long term (current) use of insulin: Secondary | ICD-10-CM | POA: Diagnosis not present

## 2023-01-22 NOTE — Telephone Encounter (Signed)
Prescription refill request for Xarelto received.  Indication: DVT Last OV: 08/07/21. Next appt scheduled for 03/13/23

## 2023-02-19 ENCOUNTER — Other Ambulatory Visit: Payer: Self-pay | Admitting: Cardiology

## 2023-03-13 ENCOUNTER — Encounter: Payer: Self-pay | Admitting: Cardiology

## 2023-03-13 ENCOUNTER — Ambulatory Visit: Payer: BC Managed Care – PPO | Attending: Cardiology | Admitting: Cardiology

## 2023-03-13 VITALS — BP 130/86 | HR 52 | Ht 70.0 in | Wt 263.8 lb

## 2023-03-13 DIAGNOSIS — I2510 Atherosclerotic heart disease of native coronary artery without angina pectoris: Secondary | ICD-10-CM | POA: Diagnosis not present

## 2023-03-13 DIAGNOSIS — I82411 Acute embolism and thrombosis of right femoral vein: Secondary | ICD-10-CM

## 2023-03-13 DIAGNOSIS — R600 Localized edema: Secondary | ICD-10-CM

## 2023-03-13 DIAGNOSIS — I214 Non-ST elevation (NSTEMI) myocardial infarction: Secondary | ICD-10-CM

## 2023-03-13 DIAGNOSIS — Z9861 Coronary angioplasty status: Secondary | ICD-10-CM

## 2023-03-13 DIAGNOSIS — I10 Essential (primary) hypertension: Secondary | ICD-10-CM

## 2023-03-13 DIAGNOSIS — E1169 Type 2 diabetes mellitus with other specified complication: Secondary | ICD-10-CM

## 2023-03-13 DIAGNOSIS — E785 Hyperlipidemia, unspecified: Secondary | ICD-10-CM

## 2023-03-13 DIAGNOSIS — Z7901 Long term (current) use of anticoagulants: Secondary | ICD-10-CM

## 2023-03-13 NOTE — Patient Instructions (Signed)
Medication Instructions:  No changes *If you need a refill on your cardiac medications before your next appointment, please call your pharmacy*   Lab Work: Not needed   Testing/Procedures:  Not needed Follow-Up: At [facility], you and your health needs are our priority.  As part of our continuing mission to provide you with exceptional heart care, we have created designated Provider Care Teams.  These Care Teams include your primary Cardiologist (physician) and Advanced Practice Providers (APPs -  Physician Assistants and Nurse Practitioners) who all work together to provide you with the care you need, when you need it.     Your next appointment:   12 month(s)  The format for your next appointment:   In Person  Provider:   David Harding, MD    
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
## 2023-03-17 ENCOUNTER — Encounter: Payer: Self-pay | Admitting: Cardiology

## 2023-03-17 NOTE — Assessment & Plan Note (Signed)
Now 12 years out from his non-STEMI requiring multivessel PCI.  He had significant lifestyle modifications with weight loss and increased level activity.  Has not had any active anginal symptoms since.  Myoview in May 2017 that was negative for ischemia.  Normal EF.  No clear indication for further ischemic evaluation unless symptoms warrant.

## 2023-03-17 NOTE — Assessment & Plan Note (Signed)
On longstanding Xarelto for history of recurrent DVTs related to his previous right leg injury.  Okay to hold Xarelto for surgeries: 24 hours of minimal risk procedures, 48 hours for intermediate risk, 72 hours for high risk

## 2023-03-17 NOTE — Assessment & Plan Note (Signed)
Lipid control continue to be excellent with diet & combination of Repatha 140 mg q2wks, atorvastatin 80 mg daily & Lovaza 1g BID A1C 6.6 (was told that he has components of type 1 diabetes in addition to type II, indicating good control. Patient has plateaued in weight loss.  -Discussed potential addition of GLP1 agonist to regimen with primary care provider. -Continue current management and consider discussing addition of GLP-1 agonist which could also potentially help with weight loss

## 2023-03-17 NOTE — Assessment & Plan Note (Signed)
Not really having any significant issues of edema now besides maybe his right ankle and leg.  Not using his as needed Lasix very frequently.

## 2023-03-17 NOTE — Assessment & Plan Note (Signed)
He has been as low as 230 pounds but is now normalized about 260 to 2065 pounds at home.  He has made significant adjustments to his diet and is quite active with exercise.  Would suggest discussing with PCP potential use of GLP-1 agonist.

## 2023-03-17 NOTE — Assessment & Plan Note (Signed)
Borderline BP today - usually better at home -Continue current doses of Lisinopril 10mg  daily & metoprolol tartrate (Lopressor) 25 mg BID.

## 2023-03-17 NOTE — Assessment & Plan Note (Signed)
Stable since multi-Vessel PCI in 2011. No chest pain, shortness of breath, or other symptoms suggestive of angina. -Continue current management: Lopressor (metoprolol tartrate) 25 mg twice daily, lisinopril 10 mg daily, Lovaza 1 g twice daily along with atorvastatin 80 mg daily;  -not on aspirin because of longstanding Xarelto -Regular physical activity with daily walks and stationary bike use.  -Continue current level of physical activity & increase if tolerated especially now that he is retired

## 2023-03-17 NOTE — Assessment & Plan Note (Signed)
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  This was done in lieu of CABG given his young age.  He is doing very well and has not had any further symptoms of angina. No longer on antiplatelet agents-now being managed with Xarelto, beta-blocker, ACE inhibitor and statin plus Lovaza.

## 2023-03-17 NOTE — Assessment & Plan Note (Signed)
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 No current symptoms of DVT. -Continue Xarelto as prescribed.

## 2023-04-24 ENCOUNTER — Other Ambulatory Visit: Payer: Self-pay | Admitting: Cardiology

## 2023-04-24 DIAGNOSIS — I82411 Acute embolism and thrombosis of right femoral vein: Secondary | ICD-10-CM

## 2023-04-24 NOTE — Telephone Encounter (Signed)
Prescription refill request for Xarelto received.  Indication: DVT Last office visit:03/13/23 Gregory Gregory)  Weight: 119.7kg Age: 67 Scr: 1.07 (03/27/23 via [facility])  CrCl:  114.8ml/min  Appropriate dose. Refill sent.

## 2023-11-12 ENCOUNTER — Other Ambulatory Visit: Payer: Self-pay | Admitting: Cardiology

## 2023-11-12 DIAGNOSIS — I82411 Acute embolism and thrombosis of right femoral vein: Secondary | ICD-10-CM

## 2023-11-12 NOTE — Telephone Encounter (Signed)
 Prescription refill request for Xarelto  received.  Indication:DVT Last office visit:10/24 Weight:119.7  kg Age:68 Scr:1.34  6/25 CrCl:91.81  ml/min  Prescription refilled

## 2024-05-19 DIAGNOSIS — I82411 Acute embolism and thrombosis of right femoral vein: Secondary | ICD-10-CM

## 2024-05-20 NOTE — Telephone Encounter (Signed)
 Prescription refill request for Xarelto  received.  Indication: DVT Last office visit: 03/13/23 Weight: 119.7 KG Age: 69 Scr: 1.14 07/21/18 Care Everywhere CrCl: 59mL/min Pt overdue for a followup. No labs since 2020
